# Patient Record
Sex: Male | Born: 1985 | Race: White | Hispanic: No | Marital: Single | Smoking: Former smoker
Health system: Southern US, Community
[De-identification: ages and names within clinical notes are randomized; demographics above are authoritative.]

## PROBLEM LIST (undated history)

## (undated) DIAGNOSIS — Z720 Tobacco use: Secondary | ICD-10-CM

## (undated) HISTORY — PX: APPENDECTOMY: SHX54

---

## 2006-01-29 ENCOUNTER — Ambulatory Visit: Payer: Self-pay | Admitting: Internal Medicine

## 2006-04-30 ENCOUNTER — Ambulatory Visit: Payer: Self-pay | Admitting: Internal Medicine

## 2009-08-05 ENCOUNTER — Emergency Department (HOSPITAL_COMMUNITY): Admission: EM | Admit: 2009-08-05 | Discharge: 2009-08-05 | Payer: Self-pay | Admitting: Emergency Medicine

## 2009-09-18 ENCOUNTER — Encounter (INDEPENDENT_AMBULATORY_CARE_PROVIDER_SITE_OTHER): Payer: Self-pay | Admitting: Surgery

## 2009-09-18 ENCOUNTER — Observation Stay (HOSPITAL_COMMUNITY): Admission: EM | Admit: 2009-09-18 | Discharge: 2009-09-19 | Payer: Self-pay | Admitting: Emergency Medicine

## 2010-05-22 ENCOUNTER — Other Ambulatory Visit: Payer: Self-pay | Admitting: Internal Medicine

## 2010-05-22 ENCOUNTER — Ambulatory Visit (INDEPENDENT_AMBULATORY_CARE_PROVIDER_SITE_OTHER): Payer: BC Managed Care – PPO | Admitting: Internal Medicine

## 2010-05-22 ENCOUNTER — Ambulatory Visit: Payer: Self-pay | Admitting: Internal Medicine

## 2010-05-22 ENCOUNTER — Encounter: Payer: Self-pay | Admitting: Internal Medicine

## 2010-05-22 DIAGNOSIS — R109 Unspecified abdominal pain: Secondary | ICD-10-CM | POA: Insufficient documentation

## 2010-05-22 DIAGNOSIS — M545 Low back pain: Secondary | ICD-10-CM

## 2010-05-22 LAB — CONVERTED CEMR LAB
Bilirubin Urine: NEGATIVE
Blood in Urine, dipstick: NEGATIVE
Urobilinogen, UA: NEGATIVE
WBC Urine, dipstick: NEGATIVE
pH: 8

## 2010-05-22 LAB — CBC WITH DIFFERENTIAL/PLATELET
Basophils Absolute: 0 10*3/uL (ref 0.0–0.1)
Eosinophils Absolute: 0.2 10*3/uL (ref 0.0–0.7)
Eosinophils Relative: 3.9 % (ref 0.0–5.0)
HCT: 45.6 % (ref 39.0–52.0)
Hemoglobin: 15.9 g/dL (ref 13.0–17.0)
Monocytes Absolute: 0.5 10*3/uL (ref 0.1–1.0)
Neutro Abs: 3.8 10*3/uL (ref 1.4–7.7)
Neutrophils Relative %: 58.9 % (ref 43.0–77.0)
Platelets: 143 10*3/uL — ABNORMAL LOW (ref 150.0–400.0)
RBC: 5.01 Mil/uL (ref 4.22–5.81)
WBC: 6.4 10*3/uL (ref 4.5–10.5)

## 2010-05-30 NOTE — Assessment & Plan Note (Signed)
Summary: stomach problems for years --on and off///sph--hasnt been see...   Vital Signs:  Patient profile:   25 year old male Weight:      193.8 pounds Temp:     98.2 degrees F oral Pulse rate:   80 / minute Resp:     14 per minute BP sitting:   130 / 84  (left arm) Cuff size:   large  Vitals Entered By: Shonna Chock CMA (May 22, 2010 10:51 AM) CC: 1.) Stomach problems   2.) Lower back pains, work related, Abdominal pain, Back pain   CC:  1.) Stomach problems   2.) Lower back pains, work related, Abdominal pain, and Back pain.  History of Present Illness:    Onset of  intermittent , "once a month" abdominal pain several years ago but progressive over past month, now lasting up to a week @ a time.He denies nausea, vomiting, diarrhea, constipation, melena, hematochezia, anorexia, and hematemesis.  The location of the pain is suprapubic.  The pain is described as cramping in quality.  Associated symptoms include weight loss of 60 # over 3 years with exercise (see  job description below) .  The patient denies the following symptoms: fever, dysuria, chest pain, jaundice, and dark urine.  The pain is worse with food.  The pain has not been  treated with  meds.He drinks 4-5 cups of coffee / day.       The patient also presents with Back pain present for years.  The patient denies weakness, loss of sensation, fecal incontinence, urinary incontinence, and urinary retention.  The pain is located in the mid low back.  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 The pain is made worse by  this work  activity.  The pain is made  minimally better by  Excedrin 5-6 pills / week.  Allergies (verified): No Known Drug Allergies  Past History:  Past Surgical History: Appendectomy 09/18/2009  Family History: Father: Living: HTN. P-Uncle:DM Mother:  Living Siblings: 2 Brother's, 3 Sister's  Physical Exam  General:  well-nourished,in no acute distress; alert,appropriate and cooperative throughout examination Eyes:  No corneal or conjunctival inflammation noted. Perrla. No icterus Mouth:  Oral mucosa and oropharynx without lesions or exudates.  Teeth in good repair.Minimal pharyngeal erythema.   Lungs:  Normal respiratory effort, chest expands symmetrically. Lungs are clear to auscultation, no crackles or wheezes. Heart:  Normal rate and regular rhythm. S1 and S2 normal without gallop, murmur, click, rub or other extra sounds. Abdomen:  Bowel sounds positive,abdomen soft and  minimally tender suprapubic area  without masses, organomegaly or hernias noted. Striae over abdomen Msk:  No deformity or scoliosis noted of thoracic or lumbar spine.   Extremities:  No clubbing, cyanosis, edema. Neg SLR  Neurologic:  alert & oriented X3, strength normal in all extremities, and DTRs symmetrical and normal.   Skin:  Intact without suspicious lesions or rashes No jaundice Cervical Nodes:  No lymphadenopathy noted Axillary Nodes:  No palpable lymphadenopathy Psych:  memory intact for recent and remote, normally interactive, and good eye contact.     Impression & Recommendations:  Problem # 1:  ABDOMINAL PAIN, SUPRAPUBIC (ICD-789.09)  Orders: Venipuncture (16109) TLB-CBC Platelet - w/Differential (85025-CBCD) Specimen Handling (60454) UA Dipstick w/o Micro (manual) (81002)  His updated medication list for this  problem includes:    Cyclobenzaprine Hcl 5 Mg Tabs (Cyclobenzaprine hcl) .Marland Kitchen... 1 at bedtime as needed  Problem # 2:  LOW BACK PAIN SYNDROME (ICD-724.2)  from lifting  His updated medication list for this problem includes:    Cyclobenzaprine Hcl 5 Mg Tabs (Cyclobenzaprine hcl) .Marland Kitchen... 1 at bedtime as needed  Complete Medication List: 1)  Multivitamins Tabs (Multiple vitamin) .Marland Kitchen.. 1 by mouth once daily 2)  Cyclobenzaprine Hcl 5 Mg  Tabs (Cyclobenzaprine hcl) .Marland Kitchen.. 1 at bedtime as needed 3)  Oscimin 0.125 Mg Subl (Hyoscyamine sulfate) .Marland Kitchen.. 1 under tongue every 6-8 hrs as needed for abd pain  Patient Instructions: 1)  Please complete stool cards Prescriptions: OSCIMIN 0.125 MG SUBL (HYOSCYAMINE SULFATE) 1 under tongue every 6-8 hrs as needed for abd pain  #30 x 1   Entered and Authorized by:   Marga Melnick MD   Signed by:   Marga Melnick MD on 05/22/2010   Method used:   Print then Give to Patient   RxID:   [id number] CYCLOBENZAPRINE HCL 5 MG TABS (CYCLOBENZAPRINE HCL) 1 at bedtime as needed  #15 x 0   Entered and Authorized by:   Marga Melnick MD   Signed by:   Marga Melnick MD on 05/22/2010   Method used:   Print then Give to Patient   RxID:   [id number]    Orders Added: 1)  Est. Patient Level IV [41660] 2)  Venipuncture [63016] 3)  TLB-CBC Platelet - w/Differential [85025-CBCD] 4)  Specimen Handling [99000] 5)  UA Dipstick w/o Micro (manual) [81002]    Laboratory Results   Urine Tests   Date/Time Reported: May 22, 2010 11:54 AM   Routine Urinalysis   Color: yellow Appearance: Clear Glucose: negative   (Normal Range: Negative) Bilirubin: negative   (Normal Range: Negative) Ketone: negative   (Normal Range: Negative) Spec. Gravity: <1.005   (Normal Range: 1.003-1.035) Blood: negative   (Normal Range: Negative) pH: 8.0   (Normal Range: 5.0-8.0) Protein: negative   (Normal Range: Negative) Urobilinogen: negative   (Normal Range: 0-1) Nitrite: negative   (Normal Range: Negative) Leukocyte Esterace: negative   (Normal Range: Negative)    Comments: Floydene Flock  May 22, 2010 11:54 AM

## 2010-06-02 ENCOUNTER — Other Ambulatory Visit (INDEPENDENT_AMBULATORY_CARE_PROVIDER_SITE_OTHER): Payer: BC Managed Care – PPO

## 2010-06-02 ENCOUNTER — Encounter (INDEPENDENT_AMBULATORY_CARE_PROVIDER_SITE_OTHER): Payer: Self-pay | Admitting: *Deleted

## 2010-06-02 ENCOUNTER — Encounter: Payer: Self-pay | Admitting: Internal Medicine

## 2010-06-02 DIAGNOSIS — Z1211 Encounter for screening for malignant neoplasm of colon: Secondary | ICD-10-CM

## 2010-06-02 LAB — CONVERTED CEMR LAB
OCCULT 1: NEGATIVE
OCCULT 2: NEGATIVE

## 2010-06-04 LAB — DIFFERENTIAL
Basophils Relative: 0 % (ref 0–1)
Eosinophils Absolute: 0 10*3/uL (ref 0.0–0.7)
Eosinophils Relative: 0 % (ref 0–5)
Lymphocytes Relative: 5 % — ABNORMAL LOW (ref 12–46)
Monocytes Relative: 5 % (ref 3–12)

## 2010-06-04 LAB — COMPREHENSIVE METABOLIC PANEL
ALT: 18 U/L (ref 0–53)
Albumin: 4.4 g/dL (ref 3.5–5.2)
BUN: 12 mg/dL (ref 6–23)
CO2: 24 mEq/L (ref 19–32)
Calcium: 9.8 mg/dL (ref 8.4–10.5)
Creatinine, Ser: 0.89 mg/dL (ref 0.4–1.5)
GFR calc Af Amer: >60 mL/min (ref >60)
Glucose, Bld: 112 mg/dL — ABNORMAL HIGH (ref 70–99)
Potassium: 3.8 mEq/L (ref 3.5–5.1)

## 2010-06-04 LAB — URINALYSIS, ROUTINE W REFLEX MICROSCOPIC
Ketones, ur: 40 mg/dL — AB
Nitrite: NEGATIVE
Protein, ur: NEGATIVE mg/dL
Specific Gravity, Urine: 1.029 (ref 1.005–1.030)
Urobilinogen, UA: 0.2 mg/dL (ref 0.0–1.0)
pH: 7.5 (ref 5.0–8.0)

## 2010-06-04 LAB — CBC
MCHC: 35.1 g/dL (ref 30.0–36.0)
MCV: 91.7 fL (ref 78.0–100.0)
RBC: 5.75 MIL/uL (ref 4.22–5.81)
WBC: 18.7 10*3/uL — ABNORMAL HIGH (ref 4.0–10.5)

## 2010-06-04 LAB — ETHANOL: Alcohol, Ethyl (B): <5 mg/dL (ref 0–10)

## 2010-06-04 LAB — LIPASE, BLOOD: Lipase: 24 U/L (ref 11–59)

## 2010-06-06 NOTE — Letter (Signed)
Summary: Results Follow up Letter  [facility]  [address] [place], [place] [address]   Phone: [phone]  Fax: [phone]    06/02/2010 MRN: [id number]  Jon Garcia [address] [place], [place]  [address]  [place]  Dear Mr. Dolinger,  The following are the results of your recent test(s):  Test         Result    Pap Smear:        Normal _____  Not Normal _____ Comments: ______________________________________________________ Cholesterol: LDL(Bad cholesterol):         Your goal is less than:         HDL (Good cholesterol):       Your goal is more than: Comments:  ______________________________________________________ Mammogram:        Normal _____  Not Normal _____ Comments:  ___________________________________________________________________ Hemoccult:        Normal ___X__  Not normal _______ Comments:    _____________________________________________________________________ Other Tests:    We routinely do not discuss normal results over the telephone.  If you desire a copy of the results, or you have any questions about this information we can discuss them at your next office visit.   Sincerely,

## 2011-04-09 IMAGING — CT CT ABD-PELV W/ CM
1 of 4 series · 15 of 34 positions shown, 19 images · IV contrast (water & 100 ML OMNI 300)
Comparison: None

CLINICAL DATA: Right-sided abdominal pain for 6 months, but
worsening today.  Leukocytosis.  Vomiting.

CT ABDOMEN AND PELVIS WITH CONTRAST
TECHNIQUE: Multidetector CT imaging of the abdomen and pelvis was
performed following the standard protocol during bolus
administration of intravenous contrast.
Contrast: 100 ml Lmnipaque-ROO

[Series 2: routine abdomen · axial · 0.81mm/px · z∈[-502,-47]mm · 15 of 101 slices shown, 19 images]
[im 5/101  soft-tissue]
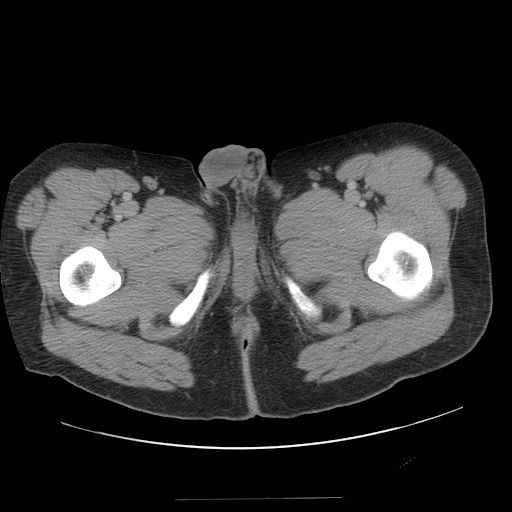
[im 5/101  bone]
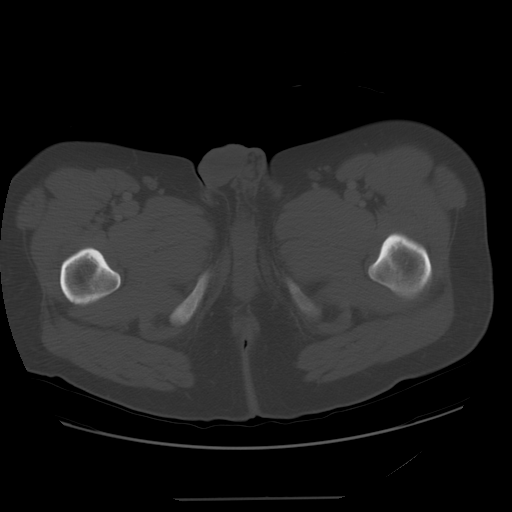
[im 13/101  soft-tissue]
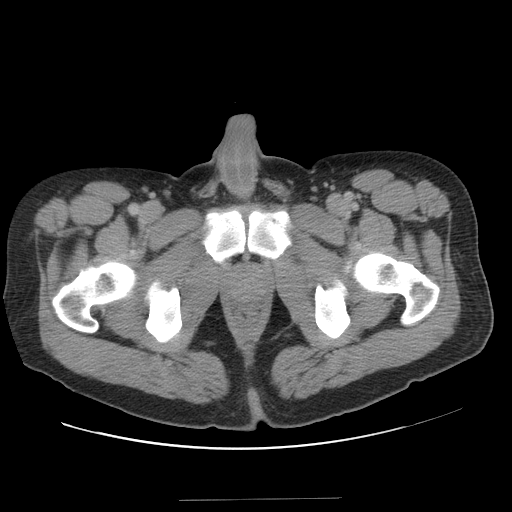
[im 21/101  soft-tissue]
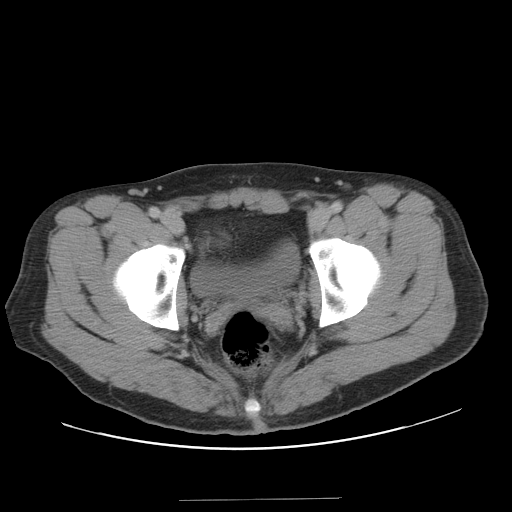
[im 30/101  soft-tissue]
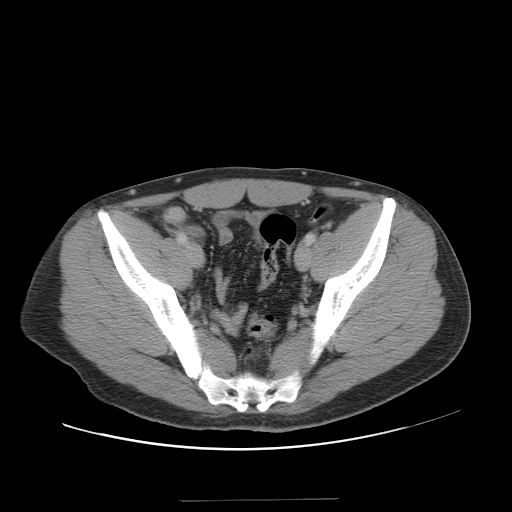
[im 34/101  soft-tissue]
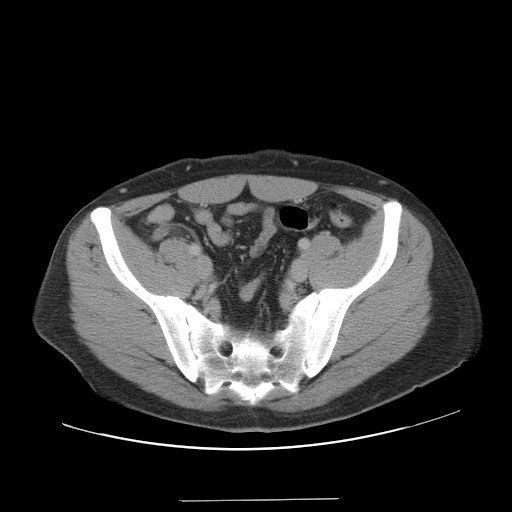
[im 42/101  soft-tissue]
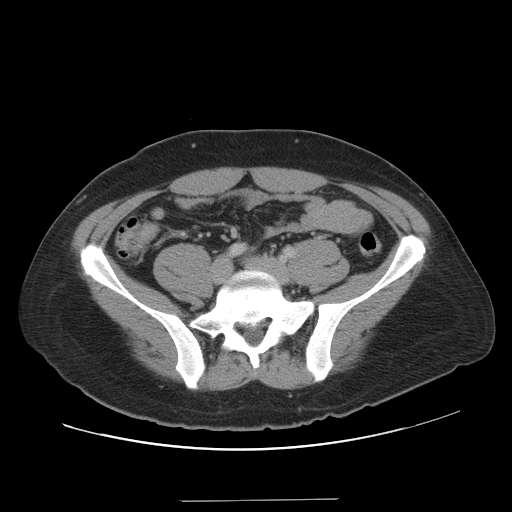
[im 51/101  soft-tissue]
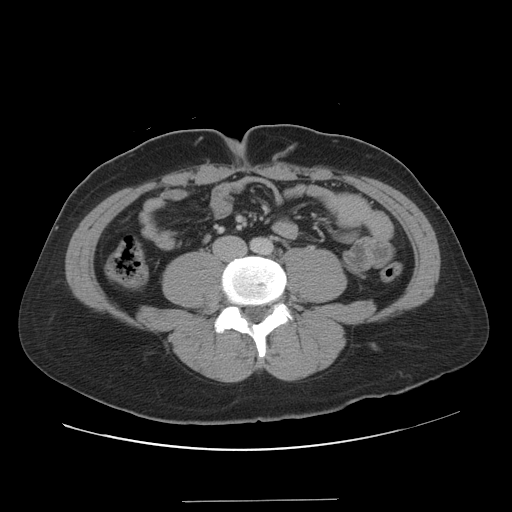
[im 59/101  soft-tissue]
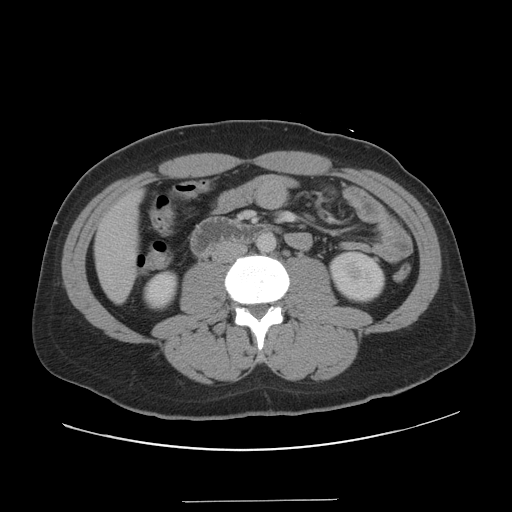
[im 67/101  soft-tissue]
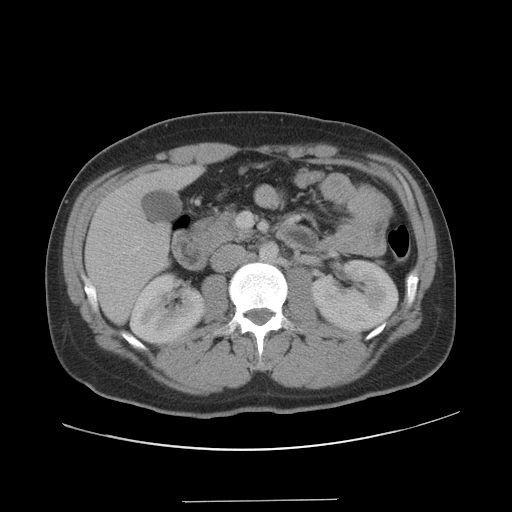
[im 67/101  bone]
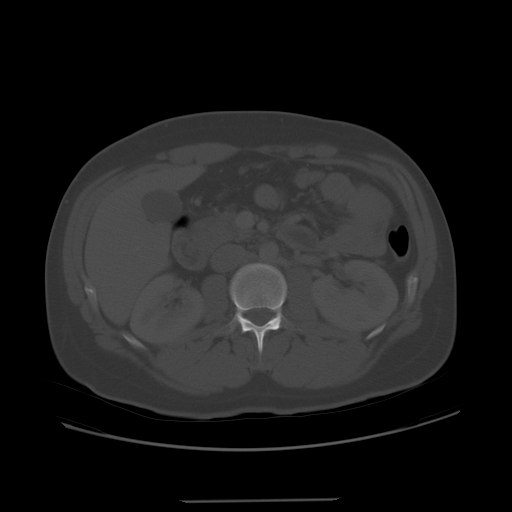
[im 71/101  soft-tissue]
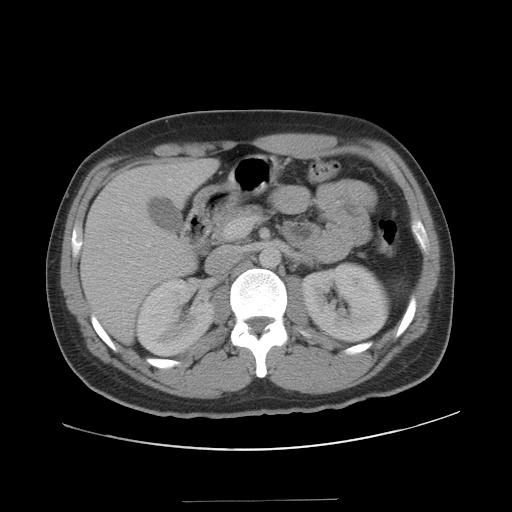
[im 80/101  soft-tissue]
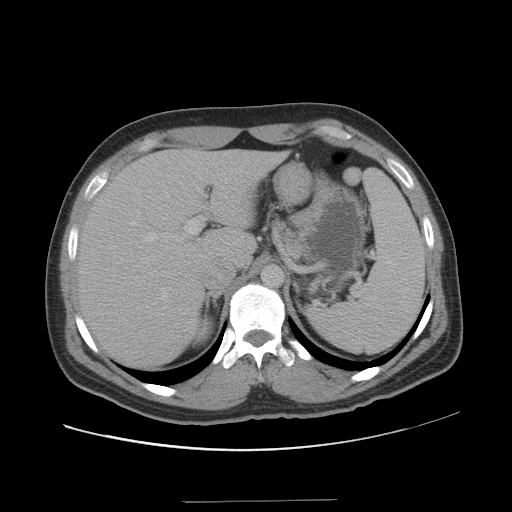
[im 84/101  lung]
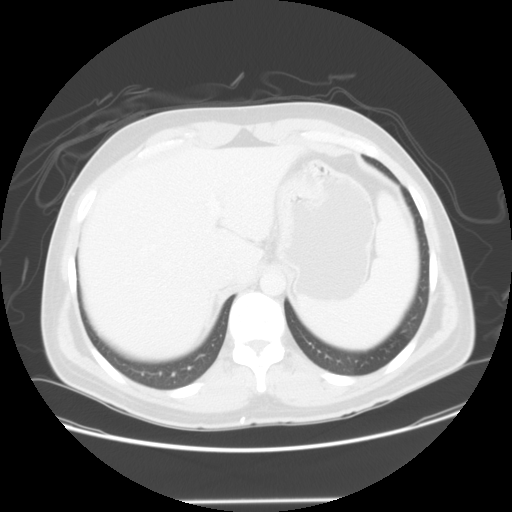
[im 88/101  soft-tissue]
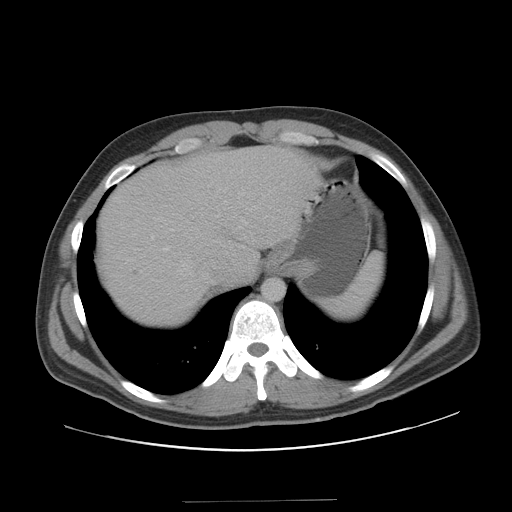
[im 88/101  lung]
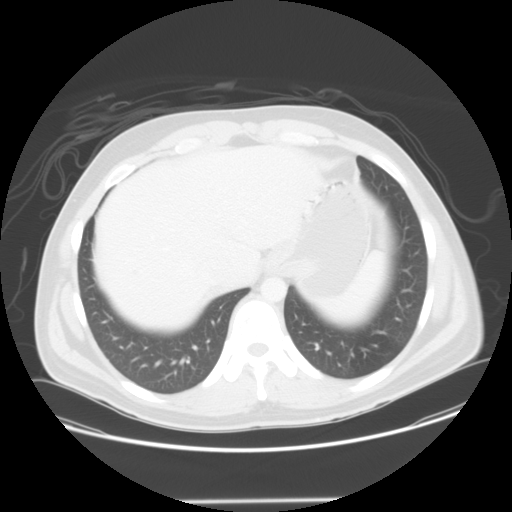
[im 92/101  lung]
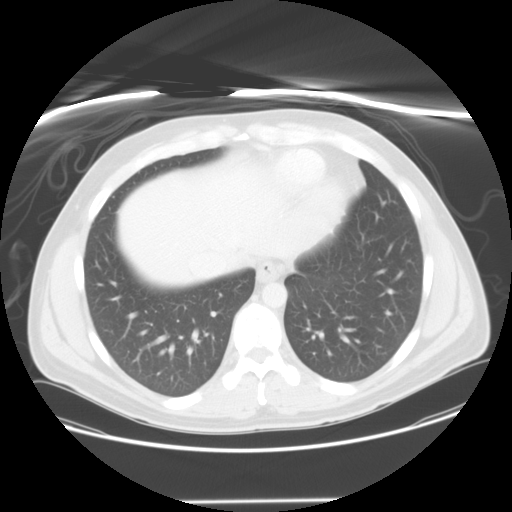
[im 96/101  soft-tissue]
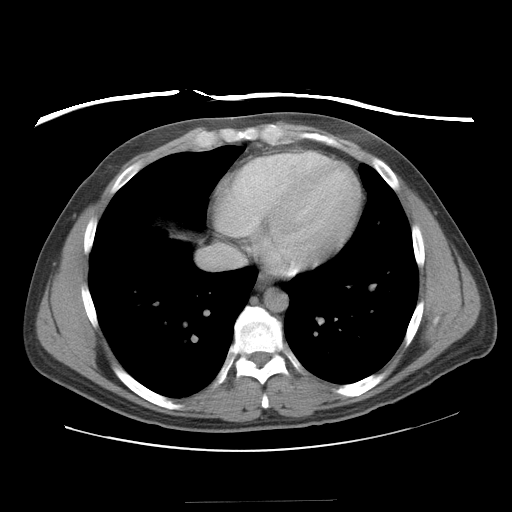
[im 96/101  lung]
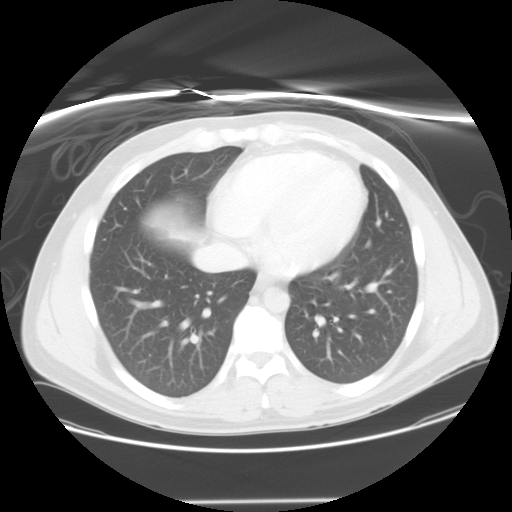

[15 of 34 positions shown; findings below may reference images not displayed]

FINDINGS: A hypodense lesion in the liver on image 14 of series 2
measures 3 mm in diameter and is technically nonspecific, although
statistically highly likely to be benign.

The spleen, adrenal glands, and pancreas appear unremarkable. The
gallbladder and biliary system appear unremarkable.

A circumaortic left renal vein noted.  Small retroperitoneal lymph
nodes are not pathologically enlarged by size criteria.

The kidneys appear unremarkable, as do the proximal ureters.

There is abnormal stranding adjacent to the appendix, which
measures 9 mm in diameter, compatible with early acute
appendicitis.

No dilated bowel noted.  Urinary bladder appears normal.  No
abscess or ascites.
IMPRESSION: 1.  Abnormal appendiceal wall thickening with surrounding stranding
compact compatible with early acute appendicitis.  No rupture or
periappendiceal abscess is currently identified.

## 2012-06-09 ENCOUNTER — Encounter (HOSPITAL_COMMUNITY): Payer: Self-pay | Admitting: Emergency Medicine

## 2012-06-09 ENCOUNTER — Emergency Department (HOSPITAL_COMMUNITY)
Admission: EM | Admit: 2012-06-09 | Discharge: 2012-06-09 | Disposition: A | Discharge status: Home / Self Care | Payer: BC Managed Care – PPO | Attending: Emergency Medicine | Admitting: Emergency Medicine

## 2012-06-09 DIAGNOSIS — F17200 Nicotine dependence, unspecified, uncomplicated: Secondary | ICD-10-CM | POA: Insufficient documentation

## 2012-06-09 DIAGNOSIS — H11419 Vascular abnormalities of conjunctiva, unspecified eye: Secondary | ICD-10-CM | POA: Insufficient documentation

## 2012-06-09 DIAGNOSIS — S058X9A Other injuries of unspecified eye and orbit, initial encounter: Secondary | ICD-10-CM | POA: Insufficient documentation

## 2012-06-09 DIAGNOSIS — Y929 Unspecified place or not applicable: Secondary | ICD-10-CM | POA: Insufficient documentation

## 2012-06-09 DIAGNOSIS — S0502XA Injury of conjunctiva and corneal abrasion without foreign body, left eye, initial encounter: Secondary | ICD-10-CM

## 2012-06-09 DIAGNOSIS — H53149 Visual discomfort, unspecified: Secondary | ICD-10-CM | POA: Insufficient documentation

## 2012-06-09 DIAGNOSIS — Y9389 Activity, other specified: Secondary | ICD-10-CM | POA: Insufficient documentation

## 2012-06-09 MED ORDER — TRAMADOL HCL 50 MG PO TABS: 50.0000 mg | ORAL_TABLET | Freq: Four times a day (QID) | ORAL | Status: DC | PRN

## 2012-06-09 MED ORDER — ERYTHROMYCIN 5 MG/GM OP OINT: TOPICAL_OINTMENT | OPHTHALMIC | Status: DC

## 2012-06-09 MED ORDER — FLUORESCEIN-BENOXINATE 0.25-0.4 % OP SOLN
2.0000 [drp] | Freq: Once | OPHTHALMIC | Status: AC
  Administered 2012-06-09: 2 [drp] via OPHTHALMIC
  Filled 2012-06-09: qty 5
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
## 2012-06-10 NOTE — ED Provider Notes (Signed)
Medical screening examination/treatment/procedure(s) were conducted as a shared visit with non-physician practitioner(s) and myself.  I personally evaluated the patient during the encounter Pt c/o ?fb sens left eye, eye injected since. Lids everted, no fb seen. Small corneal abrasion w flourescein staining. rx abx, ophthy f/u.   Suzi Roots, MD 06/10/12 [phone]

## 2015-04-01 ENCOUNTER — Telehealth: Payer: Self-pay | Admitting: Internal Medicine

## 2015-04-01 NOTE — Telephone Encounter (Signed)
Pt's dad, Darlyne RussianCharles Arline ([phone]) called in asking if you can take Jon MaduroRobert on as a new patient. He hasn't seen [facility]opp since 2012.  Please advise

## 2015-04-04 NOTE — Telephone Encounter (Signed)
yes

## 2015-04-04 NOTE — Telephone Encounter (Signed)
lmovm to call back to make an appointment

## 2022-10-24 ENCOUNTER — Emergency Department (HOSPITAL_BASED_OUTPATIENT_CLINIC_OR_DEPARTMENT_OTHER): Payer: Medicaid Other

## 2022-10-24 ENCOUNTER — Other Ambulatory Visit: Payer: Self-pay

## 2022-10-24 ENCOUNTER — Inpatient Hospital Stay (HOSPITAL_BASED_OUTPATIENT_CLINIC_OR_DEPARTMENT_OTHER)
Admission: EM | Admit: 2022-10-24 | Discharge: 2022-10-27 | DRG: 175 | Disposition: A | Discharge status: Home / Self Care | Payer: Medicaid Other | Attending: Internal Medicine | Admitting: Internal Medicine

## 2022-10-24 ENCOUNTER — Encounter (HOSPITAL_BASED_OUTPATIENT_CLINIC_OR_DEPARTMENT_OTHER): Payer: Self-pay | Admitting: Emergency Medicine

## 2022-10-24 DIAGNOSIS — I10 Essential (primary) hypertension: Secondary | ICD-10-CM | POA: Diagnosis present

## 2022-10-24 DIAGNOSIS — D72829 Elevated white blood cell count, unspecified: Secondary | ICD-10-CM | POA: Diagnosis present

## 2022-10-24 DIAGNOSIS — E871 Hypo-osmolality and hyponatremia: Secondary | ICD-10-CM | POA: Diagnosis present

## 2022-10-24 DIAGNOSIS — I824Y2 Acute embolism and thrombosis of unspecified deep veins of left proximal lower extremity: Secondary | ICD-10-CM | POA: Diagnosis not present

## 2022-10-24 DIAGNOSIS — I82432 Acute embolism and thrombosis of left popliteal vein: Secondary | ICD-10-CM | POA: Diagnosis present

## 2022-10-24 DIAGNOSIS — I82442 Acute embolism and thrombosis of left tibial vein: Secondary | ICD-10-CM | POA: Diagnosis present

## 2022-10-24 DIAGNOSIS — I82412 Acute embolism and thrombosis of left femoral vein: Secondary | ICD-10-CM | POA: Diagnosis present

## 2022-10-24 DIAGNOSIS — I82452 Acute embolism and thrombosis of left peroneal vein: Secondary | ICD-10-CM | POA: Diagnosis present

## 2022-10-24 DIAGNOSIS — F17290 Nicotine dependence, other tobacco product, uncomplicated: Secondary | ICD-10-CM | POA: Diagnosis present

## 2022-10-24 DIAGNOSIS — I2692 Saddle embolus of pulmonary artery without acute cor pulmonale: Secondary | ICD-10-CM | POA: Diagnosis not present

## 2022-10-24 DIAGNOSIS — R042 Hemoptysis: Secondary | ICD-10-CM | POA: Diagnosis present

## 2022-10-24 DIAGNOSIS — Z86711 Personal history of pulmonary embolism: Secondary | ICD-10-CM | POA: Diagnosis present

## 2022-10-24 DIAGNOSIS — I2602 Saddle embolus of pulmonary artery with acute cor pulmonale: Secondary | ICD-10-CM | POA: Diagnosis not present

## 2022-10-24 DIAGNOSIS — Z6841 Body Mass Index (BMI) 40.0 and over, adult: Secondary | ICD-10-CM

## 2022-10-24 DIAGNOSIS — Z801 Family history of malignant neoplasm of trachea, bronchus and lung: Secondary | ICD-10-CM | POA: Diagnosis not present

## 2022-10-24 DIAGNOSIS — Z79899 Other long term (current) drug therapy: Secondary | ICD-10-CM | POA: Diagnosis not present

## 2022-10-24 DIAGNOSIS — I82462 Acute embolism and thrombosis of left calf muscular vein: Secondary | ICD-10-CM | POA: Diagnosis present

## 2022-10-24 DIAGNOSIS — I2699 Other pulmonary embolism without acute cor pulmonale: Secondary | ICD-10-CM | POA: Diagnosis not present

## 2022-10-24 HISTORY — DX: Tobacco use: Z72.0

## 2022-10-24 LAB — CBC
HCT: 51.6 % (ref 39.0–52.0)
Hemoglobin: 17.9 g/dL — ABNORMAL HIGH (ref 13.0–17.0)
MCH: 29.5 pg (ref 26.0–34.0)
MCHC: 34.7 g/dL (ref 30.0–36.0)
MCV: 85.1 fL (ref 80.0–100.0)
Platelets: 180 10*3/uL (ref 150–400)
RBC: 6.06 MIL/uL — ABNORMAL HIGH (ref 4.22–5.81)
RDW: 12.6 % (ref 11.5–15.5)
WBC: 12.2 10*3/uL — ABNORMAL HIGH (ref 4.0–10.5)
nRBC: 0 % (ref 0.0–0.2)

## 2022-10-24 LAB — BASIC METABOLIC PANEL
Anion gap: 13 (ref 5–15)
BUN: 15 mg/dL (ref 6–20)
CO2: 25 mmol/L (ref 22–32)
Calcium: 9.7 mg/dL (ref 8.9–10.3)
Chloride: 100 mmol/L (ref 98–111)
Creatinine, Ser: 1.06 mg/dL (ref 0.61–1.24)
GFR, Estimated: >60 mL/min (ref >60)
Glucose, Bld: 105 mg/dL — ABNORMAL HIGH (ref 70–99)
Potassium: 4.1 mmol/L (ref 3.5–5.1)
Sodium: 138 mmol/L (ref 135–145)

## 2022-10-24 LAB — MAGNESIUM: Magnesium: 2.1 mg/dL (ref 1.7–2.4)

## 2022-10-24 LAB — TROPONIN I (HIGH SENSITIVITY)
Troponin I (High Sensitivity): 50 ng/L — ABNORMAL HIGH (ref <18)
Troponin I (High Sensitivity): 54 ng/L — ABNORMAL HIGH (ref <18)

## 2022-10-24 LAB — LACTIC ACID, PLASMA
Lactic Acid, Venous: 1.3 mmol/L (ref 0.5–1.9)
Lactic Acid, Venous: 1.3 mmol/L (ref 0.5–1.9)

## 2022-10-24 LAB — ANTITHROMBIN III: AntiThromb III Func: 93 % (ref 75–120)

## 2022-10-24 LAB — MRSA NEXT GEN BY PCR, NASAL: MRSA by PCR Next Gen: NOT DETECTED

## 2022-10-24 LAB — GLUCOSE, CAPILLARY: Glucose-Capillary: 99 mg/dL (ref 70–99)

## 2022-10-24 LAB — BRAIN NATRIURETIC PEPTIDE: B Natriuretic Peptide: 65.6 pg/mL (ref 0.0–100.0)

## 2022-10-24 MED ORDER — HYDROMORPHONE HCL 1 MG/ML IJ SOLN
1.0000 mg | INTRAMUSCULAR | Status: DC | PRN
  Administered 2022-10-24 – 2022-10-25 (×2): 1 mg via INTRAVENOUS
  Filled 2022-10-24 (×2): qty 1

## 2022-10-24 MED ORDER — POLYETHYLENE GLYCOL 3350 17 G PO PACK: 17.0000 g | PACK | Freq: Every day | ORAL | Status: DC | PRN

## 2022-10-24 MED ORDER — ACETAMINOPHEN 325 MG PO TABS
650.0000 mg | ORAL_TABLET | Freq: Four times a day (QID) | ORAL | Status: DC | PRN
  Administered 2022-10-24: 650 mg via ORAL
  Filled 2022-10-24: qty 2

## 2022-10-24 MED ORDER — HYDRALAZINE HCL 20 MG/ML IJ SOLN: 10.0000 mg | Freq: Four times a day (QID) | INTRAMUSCULAR | Status: DC | PRN

## 2022-10-24 MED ORDER — HEPARIN (PORCINE) 25000 UT/250ML-% IV SOLN
2000.0000 [IU]/h | INTRAVENOUS | Status: DC
  Administered 2022-10-24: 1750 [IU]/h via INTRAVENOUS
  Administered 2022-10-25: 2000 [IU]/h via INTRAVENOUS
  Filled 2022-10-24 (×2): qty 250

## 2022-10-24 MED ORDER — OXYCODONE HCL 5 MG PO TABS
5.0000 mg | ORAL_TABLET | ORAL | Status: DC | PRN
  Administered 2022-10-24 – 2022-10-25 (×3): 10 mg via ORAL
  Administered 2022-10-25: 5 mg via ORAL
  Filled 2022-10-24: qty 1
  Filled 2022-10-24 (×3): qty 2

## 2022-10-24 MED ORDER — CHLORHEXIDINE GLUCONATE CLOTH 2 % EX PADS
6.0000 | MEDICATED_PAD | Freq: Every day | CUTANEOUS | Status: DC
  Administered 2022-10-24 – 2022-10-27 (×3): 6 via TOPICAL

## 2022-10-24 MED ORDER — MORPHINE SULFATE (PF) 2 MG/ML IV SOLN
2.0000 mg | INTRAVENOUS | Status: DC | PRN
  Administered 2022-10-24: 2 mg via INTRAVENOUS
  Filled 2022-10-24: qty 1

## 2022-10-24 MED ORDER — DOCUSATE SODIUM 100 MG PO CAPS: 100.0000 mg | ORAL_CAPSULE | Freq: Two times a day (BID) | ORAL | Status: DC | PRN

## 2022-10-24 MED ORDER — HEPARIN BOLUS VIA INFUSION
6500.0000 [IU] | Freq: Once | INTRAVENOUS | Status: AC
  Administered 2022-10-24: 6500 [IU] via INTRAVENOUS

## 2022-10-24 MED ORDER — IOHEXOL 350 MG/ML SOLN
100.0000 mL | Freq: Once | INTRAVENOUS | Status: AC | PRN
  Administered 2022-10-24: 75 mL via INTRAVENOUS

## 2022-10-24 MED ORDER — MORPHINE SULFATE (PF) 4 MG/ML IV SOLN
4.0000 mg | Freq: Once | INTRAVENOUS | Status: AC
  Administered 2022-10-24: 4 mg via INTRAVENOUS
  Filled 2022-10-24: qty 1

## 2022-10-24 NOTE — H&P (Addendum)
NAME:  Jon Garcia, MRN:  [id number], DOB:  1985-11-20, LOS: 0 ADMISSION DATE:  10/24/2022, CONSULTATION DATE:  10/24/2022 REFERRING MD: Emmit Alexanders - EDP, CHIEF COMPLAINT:  Submassive PE   History of Present Illness:  37 year old man who presented to [facility] 8/7 for dyspnea on exertion x 1 month with decreased exercise tolerance. Reports shortness of breath and chest pain radiating to the neck worsened 4 days prior to admission.  Also reported episode of hemoptysis on date of admission. PMHx significant for tobacco/vape use.  On ED arrival, patient was tachypneic, tachycardic to 120s, and hypertensive.  Given concern for PE, CTA Chest was obtained revealing extensive saddle pulmonary embolus with significant clot burden and CT evidence of right heart strain, RV to LV ratio 1.03.  Labs were notable for Trop 50 > 54. BNP 65. WBC 12.2, Hgb 17.9, Plt 180. BMP WNL. LA 1.3. Hypercoagulability workup pending.  Given large clot burden with saddle component and possible need for intervention, PCCM consulted for ICU transfer and further management.  On arrival to [facility], patient anxious but overall well-appearing. Denies fever/chills, no current CP. Endorses mild SOB/chest pressure/palpitations. Denies nausea/vomiting, changes in bowel/urinary habits, no sick contacts. No leg swelling. Endorses tobacco and vape use and states after this incident he has decided to stop. Denies recent travel/sedentary periods, no recent surgeries, no personal or family history of blood clots that he is aware. Mother previously smoked and had lung CA. 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  Pertinent Medical History:  Tobacco/vape use  Significant Hospital Events: Including procedures, antibiotic start and stop dates in addition to other pertinent events   8/7 - Presented to [facility] with dyspnea, chest pain, and hemoptysis. CTA Chest demonstrated large saddle PE with significant clot  burden, RV/LV ratio 1.03. IR made aware, will manage conservatively for now. PCCM consulted for transfer to [facility] for further management.  Interim History / Subjective:  PCCM consulted for ICU admission in the setting of large saddle PE. Fortunately, hemodynamically stable at this time.  Objective:  Blood pressure (!) 182/110, pulse (!) 112, temperature 99 F (37.2 C), temperature source Oral, resp. rate 20, height 6' (1.829 m), weight 134.3 kg, SpO2 94%.       No intake or output data in the 24 hours ending 10/24/22 2100 Filed Weights   10/24/22 1634 10/24/22 1800  Weight: 134.3 kg 134.3 kg   Physical Examination: General: Overall well-appearing man in NAD. Appears mildly anxious. HEENT: [place]/AT, anicteric sclera, PERRL, moist mucous membranes. Neuro: Awake, oriented x 4. Responds to verbal stimuli. Following commands consistently. Moves all 4 extremities spontaneously.  CV: Tachycardic, regular rhythm, no m/g/r. PULM: Breathing even and unlabored on 3L (decreased to RA while in room without desaturation). Lung fields diminished bilaterally, no focal adventitious lung sounds. GI: Soft, nontender, nondistended. Normoactive bowel sounds. Extremities: No asymmetric LE edema noted. Skin: Warm/dry, no rashes.  Resolved Hospital Problem List:     Assessment & Plan:  Acute saddle PE, large clot burden Admitted with large saddle pulmonary embolus. CTA demonstrating extensive PE with saddle embolus with significant clot burden with evidence of mild R heart strain (RV/LV Ratio 1.03). Management options including conservative management, lytic administration and thrombectomy discussed with decision for initial conservative management and further workup, given hemodynamic stability. - Therapeutic anticoagulation with heparin gtt - F/u Echo - F/u LE Dopplers - F/u hypercoagulability workup - Eventual transition to PO anticoagulation once nearing discharge, minimum AC x 3 months -  IR aware of  patient, no urgent indication for intervention; will f/u pending studies and revisit possible thrombectomy/thrombolysis 8/8 if clinically indicated - NPO at midnight in the event IR intervention is necessary  Tobacco use Vape use - Encourage cessation  Best Practice (right click and "Reselect all SmartList Selections" daily)   Diet/type: Regular consistency (see orders) and NPO at midnight DVT prophylaxis: systemic heparin GI prophylaxis: PPI Lines: N/A Foley:  N/A Code Status:  full code Last date of multidisciplinary goals of care discussion: Patient updated at bedside 8/7PM  Labs   CBC: Recent Labs  Lab 10/24/22 1636  WBC 12.2*  HGB 17.9*  HCT 51.6  MCV 85.1  PLT 180   Basic Metabolic Panel: Recent Labs  Lab 10/24/22 1636  NA 138  K 4.1  CL 100  CO2 25  GLUCOSE 105*  BUN 15  CREATININE 1.06  CALCIUM 9.7  MG 2.1   GFR: Estimated Creatinine Clearance: 134 mL/min (by C-G formula based on SCr of 1.06 mg/dL). Recent Labs  Lab 10/24/22 1636 10/24/22 1826  WBC 12.2*  --   LATICACIDVEN  --  1.3   Liver Function Tests: No results for input(s): "AST", "ALT", "ALKPHOS", "BILITOT", "PROT", "ALBUMIN" in the last 168 hours. No results for input(s): "LIPASE", "AMYLASE" in the last 168 hours. No results for input(s): "AMMONIA" in the last 168 hours.  ABG No results found for: "PHART", "PCO2ART", "PO2ART", "HCO3", "TCO2", "ACIDBASEDEF", "O2SAT"   Coagulation Profile: No results for input(s): "INR", "PROTIME" in the last 168 hours.  Cardiac Enzymes: No results for input(s): "CKTOTAL", "CKMB", "CKMBINDEX", "TROPONINI" in the last 168 hours.  HbA1C: No results found for: "HGBA1C"  CBG: Recent Labs  Lab 10/24/22 2030  GLUCAP 99    Review of Systems:   Please see the history of present illness. All other systems reviewed and are negative   Past Medical History:  He,  has a past medical history of Tobacco use and Vapes nicotine containing substance.    Surgical History:   Past Surgical History:  Procedure Laterality Date   APPENDECTOMY      Social History:   reports that he has been smoking. He does not have any smokeless tobacco history on file. He reports current alcohol use. He reports that he does not use drugs.   Family History:  His family history is not on file.   Allergies: No Known Allergies   Home Medications: Prior to Admission medications   Medication Sig Start Date End Date Taking? Authorizing Provider  erythromycin ophthalmic ointment Place a 1/2 inch ribbon of ointment into the lower eyelid. 06/09/12   Junious Silk, PA-C  Multiple Vitamin (MULTIVITAMIN WITH MINERALS) TABS Take 1 tablet by mouth daily.    [provider]  OVER THE COUNTER MEDICATION Place 1 drop into the left eye once as needed (eye irritation.). Eye Drops.    [provider]  traMADol (ULTRAM) 50 MG tablet Take 1 tablet (50 mg total) by mouth every 6 (six) hours as needed for pain. 06/09/12   Junious Silk, PA-C   Signature:   Tim Lair, PA-C [facility] 10/24/22 9:00 PM  Please see [email or web address] for pager details.  From 7A-7P if no response, please call [phone] After hours, please call ELink [phone]

## 2022-10-24 NOTE — Progress Notes (Signed)
eLink Physician-Brief Progress Note Patient Name: Jon Garcia DOB: 08-Nov-1985 MRN: [id number]   Date of Service  10/24/2022  HPI/Events of Note  37 year old male that presented to an outside hospital with dyspnea on exertion for months, decreased exercise tolerance, shortness of breath and an episode of hemoptysis found to have acute submassive pulmonary embolus.  Patient presented tachycardic, hypertensive, and tachypneic.  Saturating 97% on room air.  Metabolic panel unremarkable with preserved renal function.  Mild elevation in troponin-stable.  Elevated hemoglobin and mild leukocytosis.  CT angiography with extensive pulmonary emboli and mild RV dysfunction.  Potential lingular infarct.  eICU Interventions  Echocardiogram pending.  Lower extremity Dopplers pending.  Maintain heparin drip  May benefit from outpatient OSA workup in the setting of polycythemia, mild RV dysfunction, and morbid obesity.  GI prophylaxis not indicated.  DVT prophylaxis with therapeutic heparin.   0025 -he has ongoing nausea.  QT prolongation in the 600s in the ED.  Repeat ECG was performed and showed QTc may be 0.29?  Seems unrealistic.  Will order Ativan as needed for nausea/vomiting for now.  Intervention Category Evaluation Type: New Patient Evaluation  Caroline Longie 10/24/2022, 9:44 PM

## 2022-10-24 NOTE — ED Triage Notes (Signed)
Dyspnea with exertion x 1 month. States he had an episode Saturday of increased SOB, chest pain radiating to his neck. States he coughed up bright red blood today.

## 2022-10-24 NOTE — Progress Notes (Signed)
ANTICOAGULATION CONSULT NOTE - Initial Consult  Pharmacy Consult for heparin Indication: pulmonary embolus  No Known Allergies  Patient Measurements: Height: 6' (182.9 cm) Weight: 134.3 kg (296 lb 1.2 oz) IBW/kg (Calculated) : 77.6 Heparin Dosing Weight: 108.2 kg   Vital Signs: Temp: 98.7 F (37.1 C) (08/07 1629) BP: 156/114 (08/07 1629) Pulse Rate: 117 (08/07 1629)  Labs: Recent Labs    10/24/22 1636  HGB 17.9*  HCT 51.6  PLT 180  CREATININE 1.06  TROPONINIHS 50*    Estimated Creatinine Clearance: 134 mL/min (by C-G formula based on SCr of 1.06 mg/dL).   Medical History: History reviewed. No pertinent past medical history.  Medications:  Scheduled:  Infusions:   Assessment: 37 yo male presenting with chest pain, SOB, and hemoptysis. CT positive for pulmonary emboli. Hgb 17.9, plt 180, no signs of bleeding noted. Pharmacy consulted to dose heparin for PE.   Goal of Therapy:  Heparin level 0.3-0.7 units/ml Monitor platelets by anticoagulation protocol: Yes   Plan:  Give heparin bolus 6500 units x 1  Then, start heparin 1750 units/hr  6 hour heparin level  Monitor CBC, heparin level, and s/sx of bleeding   Jon Garcia 10/24/2022,6:07 PM

## 2022-10-24 NOTE — ED Provider Notes (Signed)
[place] EMERGENCY DEPARTMENT AT [facility] Provider Note   CSN: [id number] Arrival date & time: 10/24/22  1623     History  Chief Complaint  Patient presents with   Shortness of Breath    Jon Garcia is a 37 y.o. male.  Patient presents to the emergency department today for evaluation of chest pain, shortness of breath, hemoptysis.  Patient has noted decreased exercise tolerance, albeit mild, over the past 1 month.  4 days ago he developed worsening pain in his left chest pain with worsening shortness of breath.  He developed a cough with hemoptysis.  Reports that right-sided chest pain radiates up into the right neck.  Patient denies risk factors for pulmonary embolism including: unilateral leg swelling, history of DVT/PE/other blood clots, use of exogenous hormones including testosterone, recent immobilizations, recent surgery, recent travel (>4hr segment), malignancy.  He does smoke tobacco.  He denies any recent respiratory illnesses including COVID.  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  He states approximately 50 pound weight gain over that time.  He does do a lot of sitting, but does get up and take breaks.  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       Home Medications Prior to Admission medications   Medication Sig Start Date End Date Taking? Authorizing Provider  erythromycin ophthalmic ointment Place a 1/2 inch ribbon of ointment into the lower eyelid. 06/09/12   Junious Silk, PA-C  Multiple Vitamin (MULTIVITAMIN WITH MINERALS) TABS Take 1 tablet by mouth daily.    [provider]  OVER THE COUNTER MEDICATION Place 1 drop into the left eye once as needed (eye irritation.). Eye Drops.    [provider]  traMADol (ULTRAM) 50 MG tablet Take 1 tablet (50 mg total) by mouth every 6 (six) hours as needed for pain. 06/09/12   Junious Silk, PA-C       Allergies    Patient has no known allergies.    Review of Systems   Review of Systems  Physical Exam Updated Vital Signs BP (!) 156/114 (BP Location: Right Arm)   Pulse (!) 117   Temp 98.7 F (37.1 C)   Resp (!) 24   Ht 6' (1.829 m)   Wt 134.3 kg   SpO2 99%   BMI 40.16 kg/m   Physical Exam Vitals and nursing note reviewed.  Constitutional:      Appearance: He is well-developed. He is not diaphoretic.  HENT:     Head: Normocephalic and atraumatic.     Mouth/Throat:     Mouth: Mucous membranes are moist. Mucous membranes are not dry.  Eyes:     Conjunctiva/sclera: Conjunctivae normal.  Neck:     Vascular: Normal carotid pulses. No carotid bruit or JVD.     Trachea: Trachea normal. No tracheal deviation.  Cardiovascular:     Rate and Rhythm: Regular rhythm. Tachycardia present.     Pulses: No decreased pulses.          Radial pulses are 2+ on the right side and 2+ on the left side.     Heart sounds: Normal heart sounds, S1 normal and S2 normal. Heart sounds not distant. No murmur heard. Pulmonary:     Effort: Pulmonary effort is normal. Tachypnea present. No respiratory distress.     Breath sounds: Normal breath sounds. No wheezing.     Comments: Mild tachypnea  but no distress Chest:     Chest wall: No tenderness.  Abdominal:     General: Bowel sounds are normal.     Palpations: Abdomen is soft.     Tenderness: There is no abdominal tenderness. There is no guarding or rebound.  Musculoskeletal:     Cervical back: Normal range of motion and neck supple. No muscular tenderness.     Right lower leg: No tenderness. No edema.     Left lower leg: No tenderness. No edema.     Comments: No clinical signs and symptoms of DVT  Skin:    General: Skin is warm and dry.     Coloration: Skin is not pale.  Neurological:     Mental Status: He is alert. Mental status is at baseline.  Psychiatric:        Mood and Affect: Mood normal.     ED Results / Procedures /  Treatments   Labs (all labs ordered are listed, but only abnormal results are displayed) Labs Reviewed  BASIC METABOLIC PANEL - Abnormal; Notable for the following components:      Result Value   Glucose, Bld 105 (*)    All other components within normal limits  CBC - Abnormal; Notable for the following components:   WBC 12.2 (*)    RBC 6.06 (*)    Hemoglobin 17.9 (*)    All other components within normal limits  MAGNESIUM  TROPONIN I (HIGH SENSITIVITY)    EKG EKG Interpretation Date/Time:  Wednesday October 24 2022 16:33:07 EDT Ventricular Rate:  116 PR Interval:  104 QRS Duration:  86 QT Interval:  454 QTC Calculation: 631 R Axis:   30  Text Interpretation: Sinus tachycardia with short PR ST & T wave abnormality, consider anterior ischemia Prolonged QT Abnormal ECG No previous ECGs available Confirmed by Jacalyn Lefevre [phone]) on 10/24/2022 5:15:45 PM  Radiology CT Angio Chest PE W and/or Wo Contrast  Result Date: 10/24/2022 CLINICAL DATA:  Dyspnea on exertion. EXAM: CT ANGIOGRAPHY CHEST WITH CONTRAST TECHNIQUE: Multidetector CT imaging of the chest was performed using the standard protocol during bolus administration of intravenous contrast. Multiplanar CT image reconstructions and MIPs were obtained to evaluate the vascular anatomy. RADIATION DOSE REDUCTION: This exam was performed according to the departmental dose-optimization program which includes automated exposure control, adjustment of the mA and/or kV according to patient size and/or use of iterative reconstruction technique. CONTRAST:  75mL OMNIPAQUE IOHEXOL 350 MG/ML SOLN COMPARISON:  X-ray earlier 10/24/2022 FINDINGS: Cardiovascular: Multiple bilateral pulmonary emboli identified including central saddle embolus. Large clot burden overall. Heart is slightly enlarged. There is some enlargement of the main pulmonary artery. Calculated RV to LV ratio 1.03. Please correlate for signs of right heart strain. Some reflux of  contrast into the intrahepatic IVC. Small pericardial effusion. Bovine type aortic arch. Normal caliber thoracic aorta. Mediastinum/Nodes: No specific abnormal lymph node enlargement identified in the axillary regions or hilum. There are a few small less than 1 cm size nodes identified in the mediastinum, nonpathologic by size criteria. Preserved thoracic esophagus. Preserved thyroid gland. Lungs/Pleura: Trace left-sided pleural fluid with the adjacent opacity. There is ill-defined hazy opacity along the lingula posterolaterally, possible developing infarct but there is a differential. Recommend follow-up. No pneumothorax. The right lung is grossly clear. Upper Abdomen: Severe fatty liver infiltration seen along the visualized portions of the liver in the upper abdomen. The adrenal glands are incompletely included in the imaging field. Musculoskeletal: Scattered degenerative changes along the spine with  bridging osteophytes and syndesmophytes. Review of the MIP images confirms the above findings. IMPRESSION: Extensive pulmonary emboli with saddle embolus. Significant clot burden. Heart is slightly enlarged as is the main pulmonary artery. Please correlate for any clinical evidence of right heart strain. Possible developing infarct along the lingula with a tiny left effusion. This has a differential overall recommend follow-up. Fatty liver infiltration. Critical Value/emergent results were called by telephone at the time of interpretation on 10/24/2022 at 3:10 pm to provider [facility] , who verbally acknowledged these results. Electronically Signed   By: Karen Kays M.D.   On: 10/24/2022 18:15   DG Chest Port 1 View  Result Date: 10/24/2022 CLINICAL DATA:  Shortness of breath EXAM: PORTABLE CHEST 1 VIEW COMPARISON:  None Available. FINDINGS: No consolidation, pneumothorax or effusion. Normal cardiopericardial silhouette without edema. Overlapping cardiac leads. Degenerative changes seen along the spine.  Underinflation. IMPRESSION: Underinflation.  No acute cardiopulmonary disease. Electronically Signed   By: Karen Kays M.D.   On: 10/24/2022 17:42    Procedures Procedures    Medications Ordered in ED Medications - No data to display  ED Course/ Medical Decision Making/ A&P    Patient seen and examined. History obtained directly from patient.   Labs/EKG: Ordered CBC, BMP, troponin.  Imaging: Ordered CT angiography of the chest to evaluate for PE, chest x-ray.  Medications/Fluids: Ordered: None ordered.   Most recent vital signs reviewed and are as follows: BP (!) 156/114 (BP Location: Right Arm)   Pulse (!) 117   Temp 98.7 F (37.1 C)   Resp (!) 24   Ht 6' (1.829 m)   Wt 134.3 kg   SpO2 99%   BMI 40.16 kg/m   Initial impression: Patient with constellation of symptoms concerning for pulmonary embolism, although unclear why he would have this.  Symptoms are not as consistent with pneumonia.  Also consider lung mass as possible etiology.  Feel high risk [place], patient will receive imaging.     Reassessment performed. Patient appears stable. Remains tachycardic.   Labs personally reviewed and interpreted including: CBC with elevated white blood cell count of 12.2, hemoglobin elevated at 17.9 otherwise unremarkable; BMP unremarkable with normal kidney function; troponin elevated at 50 indicating concern for right heart strain; magnesium 2.1.  Imaging personally visualized and interpreted including: Chest x-ray, hampton's hump noted. CT reviewed, concern for saddle pulmonary embolism with infarct.  Awaiting read.  Reviewed pertinent lab work and imaging with patient at bedside. Questions answered.   Most current vital signs reviewed and are as follows: BP (!) 156/114 (BP Location: Right Arm)   Pulse (!) 117   Temp 98.7 F (37.1 C)   Resp (!) 24   Ht 6' (1.829 m)   Wt 134.3 kg   SpO2 99%   BMI 40.16 kg/m   Plan: Will likely need admission to ICU.  Patient discussed  with Dr. Particia Nearing who will see.     6:46 PM Reassessment performed. Patient appears stable.  Labs personally reviewed and interpreted including: Ordered hypercoagulable workup prior to heparinization.  Reviewed pertinent lab work and imaging with patient at bedside. Questions answered.   Most current vital signs reviewed and are as follows: BP (!) 156/114 (BP Location: Right Arm)   Pulse (!) 117   Temp 98.7 F (37.1 C)   Resp (!) 24   Ht 6' (1.829 m)   Wt 134.3 kg   SpO2 99%   BMI 40.16 kg/m   Plan: Admit to hospital.  I was called by Dr. Chales Abrahams of radiology and discussed the case.  I spoke with APP Whitney with pulmonary critical care who has reviewed the images with Dr. Merrily Pew.  They recommend admission to the ICU.  They asked that I add lactate and BNP.  Patient may be a candidate for interventional radiology treatment.  Patient up-to-date and agrees with plan.  Heparin has been ordered.  CRITICAL CARE Performed by: Renne Crigler PA-C Total critical care time: 50 minutes Critical care time was exclusive of separately billable procedures and treating other patients. Critical care was necessary to treat or prevent imminent or life-threatening deterioration. Critical care was time spent personally by me on the following activities: development of treatment plan with patient and/or surrogate as well as nursing, discussions with consultants, evaluation of patient's response to treatment, examination of patient, obtaining history from patient or surrogate, ordering and performing treatments and interventions, ordering and review of laboratory studies, ordering and review of radiographic studies, pulse oximetry and re-evaluation of patient's condition.                                  Medical Decision Making Amount and/or Complexity of Data Reviewed Labs: ordered. Radiology: ordered.  Risk Prescription drug management. Decision regarding hospitalization.   Patient with  saddle PE with right heart strain.  He will be admitted to the ICU.  Unclear etiology with minimal risk factors at this time.        Final Clinical Impression(s) / ED Diagnoses Final diagnoses:  Acute saddle pulmonary embolism with acute cor pulmonale [facility])    Rx / DC Orders ED Discharge Orders     None         Renne Crigler, PA-C 10/24/22 1850    Jacalyn Lefevre, MD 10/24/22 1910

## 2022-10-25 ENCOUNTER — Other Ambulatory Visit (HOSPITAL_COMMUNITY): Payer: Self-pay

## 2022-10-25 ENCOUNTER — Inpatient Hospital Stay (HOSPITAL_COMMUNITY): Payer: Medicaid Other

## 2022-10-25 DIAGNOSIS — I2602 Saddle embolus of pulmonary artery with acute cor pulmonale: Secondary | ICD-10-CM

## 2022-10-25 DIAGNOSIS — Z86711 Personal history of pulmonary embolism: Secondary | ICD-10-CM

## 2022-10-25 LAB — CBC
HCT: 48.8 % (ref 39.0–52.0)
Hemoglobin: 16.3 g/dL (ref 13.0–17.0)
MCH: 29.3 pg (ref 26.0–34.0)
MCHC: 33.4 g/dL (ref 30.0–36.0)
MCV: 87.6 fL (ref 80.0–100.0)
Platelets: 155 10*3/uL (ref 150–400)
RBC: 5.57 MIL/uL (ref 4.22–5.81)
RDW: 13.2 % (ref 11.5–15.5)
WBC: 11.4 10*3/uL — ABNORMAL HIGH (ref 4.0–10.5)
nRBC: 0 % (ref 0.0–0.2)

## 2022-10-25 LAB — APTT
aPTT: 51 seconds — ABNORMAL HIGH (ref 24–36)
aPTT: 58 seconds — ABNORMAL HIGH (ref 24–36)
aPTT: 66 seconds — ABNORMAL HIGH (ref 24–36)

## 2022-10-25 LAB — PROTIME-INR
INR: 1.1 (ref 0.8–1.2)
Prothrombin Time: 14.6 seconds (ref 11.4–15.2)

## 2022-10-25 LAB — HEPARIN LEVEL (UNFRACTIONATED)
Heparin Unfractionated: 0.24 IU/mL — ABNORMAL LOW (ref 0.30–0.70)
Heparin Unfractionated: 0.25 IU/mL — ABNORMAL LOW (ref 0.30–0.70)

## 2022-10-25 MED ORDER — ONDANSETRON HCL 4 MG/2ML IJ SOLN
4.0000 mg | Freq: Four times a day (QID) | INTRAMUSCULAR | Status: DC | PRN
  Administered 2022-10-25: 4 mg via INTRAVENOUS
  Filled 2022-10-25: qty 2

## 2022-10-25 MED ORDER — HEPARIN BOLUS VIA INFUSION
5000.0000 [IU] | Freq: Once | INTRAVENOUS | Status: AC
  Administered 2022-10-25: 5000 [IU] via INTRAVENOUS
  Filled 2022-10-25: qty 5000

## 2022-10-25 MED ORDER — POTASSIUM CHLORIDE CRYS ER 20 MEQ PO TBCR
40.0000 meq | EXTENDED_RELEASE_TABLET | Freq: Once | ORAL | Status: AC
  Administered 2022-10-25: 40 meq via ORAL
  Filled 2022-10-25: qty 2

## 2022-10-25 MED ORDER — LORAZEPAM 1 MG PO TABS: 1.0000 mg | ORAL_TABLET | Freq: Four times a day (QID) | ORAL | Status: DC | PRN

## 2022-10-25 MED ORDER — HEPARIN (PORCINE) 25000 UT/250ML-% IV SOLN
2450.0000 [IU]/h | INTRAVENOUS | Status: DC
  Administered 2022-10-25: 2200 [IU]/h via INTRAVENOUS
  Administered 2022-10-26: 2400 [IU]/h via INTRAVENOUS
  Filled 2022-10-25 (×2): qty 250

## 2022-10-25 MED ORDER — SODIUM CHLORIDE 0.9 % IV SOLN
250.0000 mL | Freq: Once | INTRAVENOUS | Status: AC
  Administered 2022-10-25: 250 mL via INTRAVENOUS

## 2022-10-25 MED ORDER — ALTEPLASE (ACTIVASE) 10MG BOLUS + 40 MG INFUSION
50.0000 mg | Freq: Once | INTRAVENOUS | Status: AC
  Administered 2022-10-25: 50 mg via INTRAVENOUS
  Filled 2022-10-25: qty 50

## 2022-10-25 MED ORDER — LORAZEPAM 2 MG/ML IJ SOLN
1.0000 mg | Freq: Four times a day (QID) | INTRAMUSCULAR | Status: DC | PRN
  Administered 2022-10-25 (×2): 1 mg via INTRAVENOUS
  Filled 2022-10-25 (×3): qty 1

## 2022-10-25 NOTE — Progress Notes (Signed)
ANTICOAGULATION CONSULT NOTE  Pharmacy Consult for heparin Indication: pulmonary embolus  No Known Allergies  Patient Measurements: Height: 6' (182.9 cm) Weight: 134.3 kg (296 lb 1.2 oz) IBW/kg (Calculated) : 77.6 Heparin Dosing Weight: 108.2 kg   Vital Signs: Temp: 101.4 F (38.6 C) (08/07 2327) Temp Source: Axillary (08/07 2327) BP: 112/55 (08/08 0000) Pulse Rate: 105 (08/08 0000)  Labs: Recent Labs    10/24/22 1636 10/24/22 1836 10/25/22 0027  HGB 17.9*  --  16.8  HCT 51.6  --  49.2  PLT 180  --  168  HEPARINUNFRC  --   --  0.18*  CREATININE 1.06  --   --   TROPONINIHS 50* 54*  --     Estimated Creatinine Clearance: 134 mL/min (by C-G formula based on SCr of 1.06 mg/dL).   Medical History: Past Medical History:  Diagnosis Date   Tobacco use    Vapes nicotine containing substance       Assessment: 37 yo male presenting with chest pain, SOB, and hemoptysis. CT positive for pulmonary emboli. Hgb 17.9, plt 180, no signs of bleeding noted. Pharmacy consulted to dose heparin for PE.   8/8 AM update:  Heparin level sub-therapeutic  Saddle PE  Goal of Therapy:  Heparin level 0.3-0.7 units/ml Monitor platelets by anticoagulation protocol: Yes   Plan:  Heparin 5000 units re-bol[employer] heparin to 2000 units/hr 1000 heparin level  Abran Duke, PharmD, BCPS Clinical Pharmacist Phone: [phone]

## 2022-10-25 NOTE — Plan of Care (Signed)

## 2022-10-25 NOTE — Progress Notes (Signed)
ANTICOAGULATION CONSULT NOTE  Pharmacy Consult for heparin Indication: pulmonary embolus  No Known Allergies  Patient Measurements: Height: 6' (182.9 cm) Weight: 134.3 kg (296 lb 1.2 oz) IBW/kg (Calculated) : 77.6 Heparin Dosing Weight: 108.2 kg   Vital Signs: Temp: 98.9 F (37.2 C) (08/08 1111) Temp Source: Axillary (08/08 1111) BP: 135/90 (08/08 1300) Pulse Rate: 96 (08/08 1300)  Labs: Recent Labs    10/24/22 1636 10/24/22 1836 10/25/22 0027 10/25/22 1029 10/25/22 1037 10/25/22 1315  HGB 17.9*  --  16.8 16.3  --   --   HCT 51.6  --  49.2 48.8  --   --   PLT 180  --  168 155  --   --   APTT  --   --   --  51*  --  58*  LABPROT  --   --   --  14.6  --   --   INR  --   --   --  1.1  --   --   HEPARINUNFRC  --   --  0.18*  --  0.24*  --   CREATININE 1.06  --  1.25*  --   --   --   TROPONINIHS 50* 54*  --   --   --   --     Estimated Creatinine Clearance: 113.7 mL/min (A) (by C-G formula based on SCr of 1.25 mg/dL (H)).   Medical History: Past Medical History:  Diagnosis Date   Tobacco use    Vapes nicotine containing substance       Assessment: 37 yo male presenting with chest pain, SOB, and hemoptysis. CT positive for pulmonary emboli. Hgb 17.9, plt 180, no signs of bleeding noted. Pharmacy consulted to dose heparin for PE.   Alteplase 50mg  IV  x1 given on 8/8@ 1054. Heparin turned off at that time. Restarted heparin gtt per protocol once aPTT falls below 80 seconds.   HL 0.24 (prior to alteplase, at 2000 units/hr)- subtherapeutic aPTT 58 - below threshold to restart heparin infusion   Goal of Therapy:  Heparin level 0.3-0.7 units/ml aPTT 66-102  Monitor platelets by anticoagulation protocol: Yes   Plan:  Restart heparin infusion s/p alteplase Heparin at 2200 units/hr 6 hour HL/aPTT at 2000 Daily HL , CBC   Calton Dach, PharmD Clinical Pharmacist 10/25/2022 2:03 PM

## 2022-10-25 NOTE — Progress Notes (Signed)
VASCULAR LAB    Bilateral lower extremity venous duplex has been performed.  See CV proc for preliminary results.   Nieko Clarin, RVT 10/25/2022, 1:33 PM

## 2022-10-25 NOTE — Progress Notes (Signed)
NAME:  Jon Garcia, MRN:  [id number], DOB:  February 13, 1986, LOS: 1 ADMISSION DATE:  10/24/2022, CONSULTATION DATE:  10/24/2022 REFERRING MD: Emmit Alexanders - EDP, CHIEF COMPLAINT:  Submassive PE   History of Present Illness:  37 year old man who presented to [facility] 8/7 for dyspnea on exertion x 1 month with decreased exercise tolerance. Reports shortness of breath and chest pain radiating to the neck worsened 4 days prior to admission.  Also reported episode of hemoptysis on date of admission. PMHx significant for tobacco/vape use.  On ED arrival, patient was tachypneic, tachycardic to 120s, and hypertensive.  Given concern for PE, CTA Chest was obtained revealing extensive saddle pulmonary embolus with significant clot burden and CT evidence of right heart strain, RV to LV ratio 1.03.  Labs were notable for Trop 50 > 54. BNP 65. WBC 12.2, Hgb 17.9, Plt 180. BMP WNL. LA 1.3. Hypercoagulability workup pending.  Given large clot burden with saddle component and possible need for intervention, PCCM consulted for ICU transfer and further management.  On arrival to [facility], patient anxious but overall well-appearing. Denies fever/chills, no current CP. Endorses mild SOB/chest pressure/palpitations. Denies nausea/vomiting, changes in bowel/urinary habits, no sick contacts. No leg swelling. Endorses tobacco and vape use and states after this incident he has decided to stop. Denies recent travel/sedentary periods, no recent surgeries, no personal or family history of blood clots that he is aware. Mother previously smoked and had lung CA. 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  Pertinent Medical History:  Tobacco/vape use  Significant Hospital Events: Including procedures, antibiotic start and stop dates in addition to other pertinent events   8/7 - Presented to [facility] with dyspnea, chest pain, and hemoptysis. CTA Chest demonstrated large saddle PE with significant clot  burden, RV/LV ratio 1.03. IR made aware, will manage conservatively for now. PCCM consulted for transfer to [facility] for further management. 8/8 no acute issues overnight, remains on 2 L nasal cannula with continued complaints of pleuritic chest pain on deep inspiration  Interim History / Subjective:  Seen lying in bed in no acute distress Spouse updated at bedside  Objective:  Blood pressure 104/77, pulse 99, temperature 98.4 F (36.9 C), temperature source Oral, resp. rate 20, height 6' (1.829 m), weight 134.3 kg, SpO2 93%.        Intake/Output Summary (Last 24 hours) at 10/25/2022 0753 Last data filed at 10/25/2022 0700 Gross per 24 hour  Intake 337.48 ml  Output 500 ml  Net -162.52 ml   Filed Weights   10/24/22 1634 10/24/22 1800 10/25/22 0705  Weight: 134.3 kg 134.3 kg 134.3 kg   Physical Examination: General: No acute ill-appearing adult male lying in bed in no acute distress HEENT: [place]/AT, MM pink/moist, PERRL,  Neuro: And oriented x 3, nonfocal CV: s1s2 regular rate and rhythm, no murmur, rubs, or gallops,  PULM: Clear to auscultation bilaterally, no increased work of breathing, no added breath sounds GI: soft, bowel sounds active in all 4 quadrants, non-tender, non-distended, tolerating oral diet Extremities: warm/dry, no edema  Skin: no rashes or lesions  Resolved Hospital Problem List:     Assessment & Plan:  Acute saddle PE, large clot burden -Admitted with large saddle pulmonary embolus. CTA demonstrating extensive PE with saddle embolus with significant clot burden with evidence of mild R heart strain (RV/LV Ratio 1.03). Management options including conservative management, lytic administration and thrombectomy discussed with decision for initial conservative management and further workup, given hemodynamic stability. -  IR aware of patient, no urgent indication for intervention; will f/u pending studies and revisit possible thrombectomy/thrombolysis 8/8 if clinically  indicated P: Continue heparin drip Follow echocardiogram and lower extremity Dopplers Hypercoagulable workup pending Eventual transition to p.o. anticoagulation with a minimum AC x 3 months  Tobacco use Vape use P: Cessation education provided  Continue to monitor closely in ICU until echocardiogram completed.  If echocardiogram negative for any acute right heart strain can likely transition to oral anticoagulation and transfer to floor  Best Practice (right click and "Reselect all SmartList Selections" daily)   Diet/type: Regular consistency (see orders) and NPO at midnight DVT prophylaxis: systemic heparin GI prophylaxis: PPI Lines: N/A Foley:  N/A Code Status:  full code Last date of multidisciplinary goals of care discussion: Patient updated at bedside 8/7PM  Signature:  Rhapsody Wolven D. Harris, NP-C [facility] Personal contact information can be found on Amion  If no contact or response made please call [phone] 10/25/2022, 7:56 AM

## 2022-10-25 NOTE — Progress Notes (Deleted)
ANTICOAGULATION CONSULT NOTE  Pharmacy Consult for heparin Indication: pulmonary embolus  No Known Allergies  Patient Measurements: Height: 6' (182.9 cm) Weight: 134.3 kg (296 lb 1.2 oz) IBW/kg (Calculated) : 77.6 Heparin Dosing Weight: 108.2 kg   Vital Signs: Temp: 98.9 F (37.2 C) (08/08 1111) Temp Source: Axillary (08/08 1111) BP: 144/94 (08/08 1119) Pulse Rate: 114 (08/08 1119)  Labs: Recent Labs    10/24/22 1636 10/24/22 1836 10/25/22 0027 10/25/22 1029  HGB 17.9*  --  16.8 16.3  HCT 51.6  --  49.2 48.8  PLT 180  --  168 155  HEPARINUNFRC  --   --  0.18*  --   CREATININE 1.06  --  1.25*  --   TROPONINIHS 50* 54*  --   --     Estimated Creatinine Clearance: 113.7 mL/min (A) (by C-G formula based on SCr of 1.25 mg/dL (H)).   Medical History: Past Medical History:  Diagnosis Date   Tobacco use    Vapes nicotine containing substance       Assessment: 37 yo male presenting with chest pain, SOB, and hemoptysis. CT positive for pulmonary emboli. Hgb 17.9, plt 180, no signs of bleeding noted. Pharmacy consulted to dose heparin for PE.   Alteplase 50mg  IV  x1 given on 8/8@ 1054. Heparin turned off at that time. Restarted heparin gtt per protocol once aPTT falls below 80 seconds.   aPTT 58 - below threshold to restart heparin infusion   Goal of Therapy:  Heparin level 0.3-0.7 units/ml aPTT 66-102  Monitor platelets by anticoagulation protocol: Yes   Plan:  Restart heparin infusion s/p alteplase Heparin at 2200 units/hr 6 hour HL/aPTT at 2000 Daily HL , CBC   Calton Dach, PharmD Clinical Pharmacist 10/25/2022 11:26 AM

## 2022-10-25 NOTE — Progress Notes (Signed)
ANTICOAGULATION CONSULT NOTE  Pharmacy Consult for heparin Indication: pulmonary embolus  No Known Allergies  Patient Measurements: Height: 6' (182.9 cm) Weight: 134.3 kg (296 lb 1.2 oz) IBW/kg (Calculated) : 77.6 Heparin Dosing Weight: 108.2 kg   Vital Signs: Temp: 98.4 F (36.9 C) (08/08 1931) Temp Source: Oral (08/08 1931) BP: 130/81 (08/08 2000) Pulse Rate: 101 (08/08 2000)  Labs: Recent Labs    10/24/22 1636 10/24/22 1836 10/25/22 0027 10/25/22 1029 10/25/22 1037 10/25/22 1315 10/25/22 2036  HGB 17.9*  --  16.8 16.3  --   --   --   HCT 51.6  --  49.2 48.8  --   --   --   PLT 180  --  168 155  --   --   --   APTT  --   --   --  51*  --  58* 66*  LABPROT  --   --   --  14.6  --   --   --   INR  --   --   --  1.1  --   --   --   HEPARINUNFRC  --   --  0.18*  --  0.24*  --  0.25*  CREATININE 1.06  --  1.25*  --   --   --   --   TROPONINIHS 50* 54*  --   --   --   --   --     Estimated Creatinine Clearance: 113.7 mL/min (A) (by C-G formula based on SCr of 1.25 mg/dL (H)).   Medical History: Past Medical History:  Diagnosis Date   Tobacco use    Vapes nicotine containing substance       Assessment: 37 yo male presenting with chest pain, SOB, and hemoptysis. CT positive for pulmonary emboli. Hgb 17.9, plt 180, no signs of bleeding noted. Pharmacy consulted to dose heparin for PE.   Alteplase 50mg  IV  x1 given on 8/8@ 1054. Heparin turned off at that time. Restarted heparin gtt per protocol once aPTT falls below 80 seconds.   Heparin level subtherapeutic (0.24) on infusion at 2200 units/hr. No issues with line or bleeding reported per RN.  Goal of Therapy:  Heparin level 0.3-0.5 units/ml (until 8/9 1100 - 24h post alteplase) then back to 0.3-0.7 units/ml Monitor platelets by anticoagulation protocol: Yes   Plan:  Increase heparin to 2400 units/hr F/u 6 hr heparin level  Christoper Fabian, PharmD, BCPS Please see amion for complete clinical pharmacist phone  list 10/25/2022 9:37 PM

## 2022-10-26 ENCOUNTER — Telehealth: Payer: Self-pay | Admitting: Internal Medicine

## 2022-10-26 ENCOUNTER — Other Ambulatory Visit (HOSPITAL_COMMUNITY): Payer: Self-pay

## 2022-10-26 DIAGNOSIS — I2609 Other pulmonary embolism with acute cor pulmonale: Secondary | ICD-10-CM

## 2022-10-26 MED ORDER — APIXABAN 5 MG PO TABS: 5.0000 mg | ORAL_TABLET | Freq: Two times a day (BID) | ORAL | Status: DC

## 2022-10-26 MED ORDER — APIXABAN 5 MG PO TABS
10.0000 mg | ORAL_TABLET | Freq: Two times a day (BID) | ORAL | Status: DC
  Administered 2022-10-26 – 2022-10-27 (×3): 10 mg via ORAL
  Filled 2022-10-26 (×3): qty 2

## 2022-10-26 NOTE — TOC CM/SW Note (Signed)
Transition of Care [facility]) - Inpatient Brief Assessment   Patient Details  Name: Jon Garcia MRN: [id number] Date of Birth: Jun 25, 1985  Transition of Care [facility]) CM/SW Contact:    Tom-Johnson, Hershal Coria, RN Phone Number: 10/26/2022, 3:55 PM   Clinical Narrative:  Patient presented to the ED with worsening SOB, Chest Pain and  Hemoptysis. Found to have Pulmonary Embolism. IR consulted,  manage conservatively for now, no intervention at this time. On Heparin gtt, will transition to Oral anticoagulation at discharge.   From home with Fiance and Step daughter. Self employed as a Optician, dispensing. Independent with care and drive self prior to admission. Has five supportive siblings.  Does not have a PCP, New Patient establishment and hospital f/u scheduled at [facility], info on AVS.  Patient does not have Medical Insurance and will be discharging home on Oral Anticoagulation.  MATCH will be done at discharge.  Patient uses [facility] on [address].   CM will continue to follow as patient progresses with care towards discharge.        Transition of Care Asessment: Insurance and Status: Insurance coverage has been reviewed Patient has primary care physician: No (New Patient establishment at [facility]) Home environment has been reviewed: Yes Prior level of function:: Independent Prior/Current Home Services: No current home services Social Determinants of Health Reivew: SDOH reviewed no interventions necessary Readmission risk has been reviewed: Yes Transition of care needs: transition of care needs identified, TOC will continue to follow

## 2022-10-26 NOTE — Discharge Instructions (Addendum)
Information on my medicine - ELIQUIS (apixaban)  This medication education was reviewed with me or my healthcare representative as part of my discharge preparation.    Why was Eliquis prescribed for you? Eliquis was prescribed to treat blood clots that may have been found in the veins of your legs (deep vein thrombosis) or in your lungs (pulmonary embolism) and to reduce the risk of them occurring again.  What do You need to know about Eliquis ? The starting dose is 10 mg (two 5 mg tablets) taken TWICE daily for the FIRST SEVEN (7) DAYS, then on 11/02/22 the dose is reduced to ONE 5 mg tablet taken TWICE daily.  Eliquis may be taken with or without food.   Try to take the dose about the same time in the morning and in the evening. If you have difficulty swallowing the tablet whole please discuss with your pharmacist how to take the medication safely.  Take Eliquis exactly as prescribed and DO NOT stop taking Eliquis without talking to the doctor who prescribed the medication.  Stopping may increase your risk of developing a new blood clot.  Refill your prescription before you run out.  After discharge, you should have regular check-up appointments with your healthcare provider that is prescribing your Eliquis.    What do you do if you miss a dose? If a dose of ELIQUIS is not taken at the scheduled time, take it as soon as possible on the same day and twice-daily administration should be resumed. The dose should not be doubled to make up for a missed dose.  Important Safety Information A possible side effect of Eliquis is bleeding. You should call your healthcare provider right away if you experience any of the following: Bleeding from an injury or your nose that does not stop. Unusual colored urine (red or dark brown) or unusual colored stools (red or black). Unusual bruising for unknown reasons. A serious fall or if you hit your head (even if there is no bleeding).  Some medicines  may interact with Eliquis and might increase your risk of bleeding or clotting while on Eliquis. To help avoid this, consult your healthcare provider or pharmacist prior to using any new prescription or non-prescription medications, including herbals, vitamins, non-steroidal anti-inflammatory drugs (NSAIDs) and supplements.  This website has more information on Eliquis (apixaban): http://www.eliquis.com/eliquis/home

## 2022-10-26 NOTE — Progress Notes (Signed)
ANTICOAGULATION CONSULT NOTE  Pharmacy Consult for heparin Indication: pulmonary embolus  No Known Allergies  Patient Measurements: Height: 6' (182.9 cm) Weight: 132 kg (291 lb 0.1 oz) IBW/kg (Calculated) : 77.6 Heparin Dosing Weight: 108.2 kg   Vital Signs: Temp: 98.1 F (36.7 C) (08/09 0714) Temp Source: Oral (08/09 0714) BP: 105/68 (08/09 0600) Pulse Rate: 89 (08/09 0600)  Labs: Recent Labs    10/24/22 1636 10/24/22 1636 10/24/22 1836 10/25/22 0027 10/25/22 1029 10/25/22 1037 10/25/22 1315 10/25/22 2036 10/26/22 0358  HGB 17.9*  --   --  16.8 16.3  --   --   --  15.2  HCT 51.6  --   --  49.2 48.8  --   --   --  44.5  PLT 180  --   --  168 155  --   --   --  136*  APTT  --   --   --   --  51*  --  58* 66*  --   LABPROT  --   --   --   --  14.6  --   --   --   --   INR  --   --   --   --  1.1  --   --   --   --   HEPARINUNFRC  --    < >  --  0.18*  --  0.24*  --  0.25* 0.33  CREATININE 1.06  --   --  1.25*  --   --   --   --   --   TROPONINIHS 50*  --  54*  --   --   --   --   --   --    < > = values in this interval not displayed.    Estimated Creatinine Clearance: 112.7 mL/min (A) (by C-G formula based on SCr of 1.25 mg/dL (H)).   Medical History: Past Medical History:  Diagnosis Date   Tobacco use    Vapes nicotine containing substance       Assessment: 37 yo male presenting with chest pain, SOB, and hemoptysis. CT positive for pulmonary emboli. Pharmacy consulted to dose heparin.   Alteplase 50mg  IV  x1 given on 8/8@ 1054. Heparin turned off at that time. Restarted heparin gtt per protocol once aPTT falls below 80 seconds.   AM update - Heparin level therapeutic (0.33) at bottom of range. Hg wnl, plt down to 136. No issues with line or bleeding reported per RN.  Goal of Therapy:  Heparin level 0.3-0.5 units/ml (until 8/9 1100 - 24hrs post-alteplase) then back to 0.3-0.7 units/ml Monitor platelets by anticoagulation protocol: Yes   Plan:   Increase heparin slightly to 2450 units/hr to ensure stays in range 6 hr heparin level to confirm Monitor daily CBC, s/sx bleeding F/u long-term anticoagulation plan   Leia Alf, PharmD, BCPS Please check AMION for all [facility] contact numbers Clinical Pharmacist 10/26/2022 7:22 AM

## 2022-10-26 NOTE — Telephone Encounter (Signed)
Needs follow up in 3 months after echocardiogram (ordered) with APP or ND.

## 2022-10-26 NOTE — Plan of Care (Signed)

## 2022-10-26 NOTE — Telephone Encounter (Signed)
Patient scheduled 01/21/2023 at 11am with Dr. Celine Mans. Reminder mailed to address on file. Nothing further needed.

## 2022-10-26 NOTE — Progress Notes (Signed)
NAME:  Jon Garcia, MRN:  [id number], DOB:  01/10/1986, LOS: 2 ADMISSION DATE:  10/24/2022, CONSULTATION DATE:  10/24/22 REFERRING MD:  Emmit Alexanders, EDP, CHIEF COMPLAINT:  submassive PE   History of Present Illness:  37 year old man who presented to [facility] 8/7 for dyspnea on exertion x 1 month with decreased exercise tolerance. Reports shortness of breath and chest pain radiating to the neck worsened 4 days prior to admission.  Also reported episode of hemoptysis on date of admission. PMHx significant for tobacco/vape use.   On ED arrival, patient was tachypneic, tachycardic to 120s, and hypertensive.  Given concern for PE, CTA Chest was obtained revealing extensive saddle pulmonary embolus with significant clot burden and CT evidence of right heart strain, RV to LV ratio 1.03.  Labs were notable for Trop 50 > 54. BNP 65. WBC 12.2, Hgb 17.9, Plt 180. BMP WNL. LA 1.3. Hypercoagulability workup pending.   Given large clot burden with saddle component and possible need for intervention, PCCM consulted for ICU transfer and further management.   On arrival to [facility], patient anxious but overall well-appearing. Denies fever/chills, no current CP. Endorses mild SOB/chest pressure/palpitations. Denies nausea/vomiting, changes in bowel/urinary habits, no sick contacts. No leg swelling. Endorses tobacco and vape use and states after this incident he has decided to stop. Denies recent travel/sedentary periods, no recent surgeries, no personal or family history of blood clots that he is aware. Mother previously smoked and had lung CA. 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  Pertinent  Medical History  Tobacco and vape use  Significant Hospital Events: Including procedures, antibiotic start and stop dates in addition to other pertinent events   8/7 - Presented to [facility] with dyspnea, chest pain, and hemoptysis. CTA Chest demonstrated large saddle PE with significant  clot burden, RV/LV ratio 1.03. IR made aware, will manage conservatively for now. PCCM consulted for transfer to [facility] for further management. 8/8 no acute issues overnight, remains on 2 L nasal cannula with continued complaints of pleuritic chest pain on deep inspiration  Interim History / Subjective:  Doing well this morning. Pain and SOB have improved greatly. He has been able to eat and drink more. He is mobilizing well on his own. No LLE pain.  Objective   Blood pressure 115/75, pulse 87, temperature 98.1 F (36.7 C), temperature source Oral, resp. rate 20, height 6' (1.829 m), weight 132 kg, SpO2 95%.        Intake/Output Summary (Last 24 hours) at 10/26/2022 0731 Last data filed at 10/26/2022 0600 Gross per 24 hour  Intake 931 ml  Output 750 ml  Net 181 ml   Filed Weights   10/24/22 1800 10/25/22 0705 10/26/22 0500  Weight: 134.3 kg 134.3 kg 132 kg    Examination: General: Sitting up in bed, NAD HENT: NCAT, EOM grossly intact Lungs: CTAB, no m/r/g Cardiovascular: RRR without m/r/g Abdomen: Soft, nontender Neuro: Alert and oriented, able to answer questions appropriately, moving all extremities and mobilizing well  Assessment & Plan:  Acute saddle PE, large clot burden Admitted with CTA evidence of large saddle pulmonary embolus with mild R heart strain (RV/LV Ratio 1.03), PESI score high risk for 30 day mortality at 108 points. Initially managed conservatively given overall reassuring appearance; however, discussed and proceeded with half-dose alteplase 8/8. Echo returned with normal EF, mild LVH, RV/RA mildly enlarged. DVT US with acute DVT of left common femoral, left femoral, left popliteal, left posterior tibial, left peroneal,  and left gastrocnemius veins. P: Continue heparin drip, s/p alteplase once Hypercoagulable workup pending Eventual transition to p.o. anticoagulation indefinitely given unprovoked PE status Will need echo at 3 months (around January 24, 2023)    Tobacco use Vape use P: Cessation education previously provided   Given stable status without evidence of significant, hemodynamically unstable right heart strain on echo, can transfer to floor tomorrow, 8/10. Will sign out to [facility].  Best Practice (right click and "Reselect all SmartList Selections" daily)   Diet/type: Regular consistency (see orders) DVT prophylaxis: systemic heparin GI prophylaxis: PPI Lines: N/A Foley:  N/A Code Status:  full code Last date of multidisciplinary goals of care discussion: Patient updated at bedside 8/7PM  Labs   CBC: Recent Labs  Lab 10/24/22 1636 10/25/22 0027 10/25/22 1029 10/26/22 0358  WBC 12.2* 12.9* 11.4* 9.4  HGB 17.9* 16.8 16.3 15.2  HCT 51.6 49.2 48.8 44.5  MCV 85.1 87.9 87.6 88.1  PLT 180 168 155 136*    Basic Metabolic Panel: Recent Labs  Lab 10/24/22 1636 10/25/22 0027  NA 138 134*  K 4.1 3.5  CL 100 101  CO2 25 20*  GLUCOSE 105* 132*  BUN 15 15  CREATININE 1.06 1.25*  CALCIUM 9.7 8.7*  MG 2.1 2.1  PHOS  --  3.8   Coagulation Profile: Recent Labs  Lab 10/25/22 1029  INR 1.1   Critical care time:     Janeal Holmes, MD

## 2022-10-27 ENCOUNTER — Other Ambulatory Visit: Payer: Self-pay | Admitting: Nurse Practitioner

## 2022-10-27 DIAGNOSIS — I2692 Saddle embolus of pulmonary artery without acute cor pulmonale: Secondary | ICD-10-CM

## 2022-10-27 DIAGNOSIS — I824Y2 Acute embolism and thrombosis of unspecified deep veins of left proximal lower extremity: Secondary | ICD-10-CM

## 2022-10-27 MED ORDER — APIXABAN 5 MG PO TABS: ORAL_TABLET | ORAL | 0 refills | Status: DC

## 2022-10-27 NOTE — Hospital Course (Signed)
37 year old male with past medical history of smoking presented to the hospital with dyspnea for a month and decreased exercise tolerance with some chest discomfort.  In the ED patient was tachycardic tachypneic and hypertensive.  CTA of the chest was done which showed extensive saddle pulmonary embolus with significant clot burden and CT evidence of right heart strain.   RV to LV ratio 1.03.  Labs were notable for Trop 50 > 54. BNP 65. WBC 12.2, Hgb 17.9, Plt 180. BMP WNL. LA 1.3. Given large clot burden with saddle component and possible need for intervention, PCCM was consulted for ICU transfer and further management.  Assessment and plan.  Acute saddle PE, large clot burden  Admitted with CTA evidence of large saddle pulmonary embolus with mild R heart strain (RV/LV Ratio 1.03), PESI score high risk for 30 day mortality at 108 points.  Patient subsequently underwent half-dose alteplase 8/8. Echo returned with normal EF, mild LVH, RV/RA mildly enlarged. DVT US with acute DVT of left common femoral, left femoral, left popliteal, left posterior tibial, left peroneal, and left gastrocnemius veins.  Was continued on heparin drip.  Hypercoagulable workup was sent.  Patient will likely need indefinite anticoagulation due to unprovoked PE status and repeat echo in 3 months.  At this time anticoagulation has been changed to Eliquis.   Tobacco use Vape use Cessation advised.

## 2022-10-27 NOTE — Discharge Summary (Signed)
Physician Discharge Summary         Patient ID: Jon Garcia MRN: [id number] DOB/AGE: [birth year] 37 y.o.  Admit date: 10/24/2022 Discharge date: 10/27/2022  Discharge Diagnoses:    Active Hospital Problems   Diagnosis Date Noted   Pulmonary embolism [facility]) 10/24/2022    Resolved Hospital Problems  No resolved problems to display.      Discharge summary    37 year old man who presented to [facility] 8/7 for dyspnea on exertion x 1 month with decreased exercise tolerance. Reports shortness of breath and chest pain radiating to the neck worsened 4 days prior to admission.  Also reported episode of hemoptysis on date of admission. PMHx significant for tobacco/vape use.   On ED arrival, patient was tachypneic, tachycardic to 120s, and hypertensive.  Given concern for PE, CTA Chest was obtained revealing extensive saddle pulmonary embolus with significant clot burden and CT evidence of right heart strain, RV to LV ratio 1.03.  Labs were notable for Trop 50 > 54. BNP 65. WBC 12.2, Hgb 17.9, Plt 180. BMP WNL. LA 1.3. Hypercoagulability workup ordered.  Given large clot burden with saddle component, high PESI score, and possible need for intervention, PCCM consulted for ICU transfer and further management.  On arrival to [facility], patient anxious but overall well-appearing. Denies fever/chills, no current CP. Endorses mild SOB/chest pressure/palpitations. Denies nausea/vomiting, changes in bowel/urinary habits, no sick contacts. No leg swelling. Endorses tobacco and vape use and states after this incident he has decided to stop. Denies recent travel/sedentary periods, no recent surgeries, no personal or family history of blood clots that he is aware. Mother previously smoked and had lung CA. 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  Given high PESI score, tachycardia, and hypoxemia and after risk/ benefit discussion, patient elected to proceed with half  dose systemic thrombolysis on 8/8 with improvement in HR, hemodynamics, and pain.  Heparin resumed per pharmacy and transitioned to eliquis 8/9.  Remains hemodynamically stable, no complaints, and normoxia with no desaturations on ambulation around ICU 8/10, to be discharged home with follow-up with FPTS to establish w/ PCP.     Discharge Plan by Active Problems    Acute saddle PE, large clot burden Admitted with CTA evidence of large saddle pulmonary embolus with mild R heart strain (RV/LV Ratio 1.03), PESI score high risk for 30 day mortality at 108 points. - Initially managed conservatively given overall reassuring appearance; however, discussed and proceeded with half-dose alteplase 8/8 due to high PESI score, pain, tachycardia, and hypoxemia - TTE 8/8 returned with normal EF, mild LVH, RV/RA mildly enlarged. - DVT US with acute DVT of left common femoral, left femoral, left popliteal, left posterior tibial, left peroneal, and left gastrocnemius veins. P: - continue eliquis 10mg  BID for seven days total (started 8/9), then 5mg  BID.  Voucher given for one month supply per pharmacy.  - hypercoagulable panel> factor 5 leiden, lupus anticoagulant, prothrombin gene mutation, protein S total and activity,   pending.  Cardiolipin ab, Homcysteine, antrithrombin III, beta-2 glyco IgG neg.  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  Consider hematology evaluation.  - advised no strenuous activity for at least 1 month - remains hemodynamically and asymptomatic for discharge home.  No desaturations/ SOB  on ambulation.  - F/u at [facility] pulmonary office on 11/4/204 with Dr. Celine Mans.      Tobacco  use Vape use P: - Cessation education provided.   Pt relays more of a behavioral component, has been ok in hospital.   Social Needs - pt uninsured.  Has qualified for medicare per pt/ fiance and has  follow-up to see FPTS, Dr. Mliss Sax on 8/16 to establish with PCP   Significant Hospital tests/ studies  8/7 CTA PE 8/8 TTE 8/8 LE dupplex  Procedures    Culture data/antimicrobials   8/7  MRSA PCR neg   Consults     Discharge Exam: BP 122/81   Pulse 86   Temp 98.5 F (36.9 C) (Oral)   Resp (!) 22   Ht 6' (1.829 m)   Wt 132 kg   SpO2 97%   BMI 39.47 kg/m   General:  Pleasant obese male sitting in bed in NAD Neuro: alert and oriented, appropriate, MAE CV: rr, no murmur PULM:  non labored, CTA GI: soft, bs+, NT Extremities: warm/dry, slight LLE edema - NT Skin: no rashes   Labs at discharge   Lab Results  Component Value Date   CREATININE 1.25 (H) 10/25/2022   BUN 15 10/25/2022   NA 134 (L) 10/25/2022   K 3.5 10/25/2022   CL 101 10/25/2022   CO2 20 (L) 10/25/2022   Lab Results  Component Value Date   WBC 7.5 10/27/2022   HGB 14.4 10/27/2022   HCT 43.2 10/27/2022   MCV 86.4 10/27/2022   PLT 146 (L) 10/27/2022   Lab Results  Component Value Date   ALT 18 09/18/2009   AST 22 09/18/2009   ALKPHOS 63 09/18/2009   BILITOT 1.1 09/18/2009   Lab Results  Component Value Date   INR 1.1 10/25/2022    Current radiological studies    No results found.  Disposition:    Discharge disposition: 01-Home or Self Care     Home  Discharge Instructions     Increase activity slowly   Complete by: As directed    No strenuous activity       Allergies as of 10/27/2022   No Known Allergies      Medication List     TAKE these medications    apixaban 5 MG Tabs tablet Commonly known as: ELIQUIS Take two pills twice a day till 8/15, then on 8/16 decrease to one pill twice daily by mouth.         Follow-up appointment   FPTS- Dr. Mliss Sax on 8/16 PCCM- 11/4/204 with Dr. Celine Mans.  Discharge Condition:    stable   Time spent in discharge 35 mins     Posey Boyer, MSN, NP, AG-ACNP-BC [facility] 10/27/2022, 2:24  PM  See Amion for pager If no response to pager , please call [phone] until 7pm After 7:00 pm call Elink  [phone]

## 2022-11-02 ENCOUNTER — Encounter: Payer: Self-pay | Admitting: Family Medicine

## 2022-11-02 ENCOUNTER — Ambulatory Visit (INDEPENDENT_AMBULATORY_CARE_PROVIDER_SITE_OTHER): Payer: Medicaid Other | Admitting: Family Medicine

## 2022-11-02 ENCOUNTER — Other Ambulatory Visit: Payer: Self-pay

## 2022-11-02 VITALS — BP 143/99 | HR 89 | Ht 72.0 in | Wt 295.0 lb

## 2022-11-02 DIAGNOSIS — I2692 Saddle embolus of pulmonary artery without acute cor pulmonale: Secondary | ICD-10-CM

## 2022-11-02 DIAGNOSIS — N179 Acute kidney failure, unspecified: Secondary | ICD-10-CM

## 2022-11-02 DIAGNOSIS — Z720 Tobacco use: Secondary | ICD-10-CM

## 2022-11-02 DIAGNOSIS — F17201 Nicotine dependence, unspecified, in remission: Secondary | ICD-10-CM | POA: Insufficient documentation

## 2022-11-02 DIAGNOSIS — F17200 Nicotine dependence, unspecified, uncomplicated: Secondary | ICD-10-CM | POA: Insufficient documentation

## 2022-11-02 HISTORY — DX: Acute kidney failure, unspecified: N17.9

## 2022-11-02 NOTE — Progress Notes (Signed)
    SUBJECTIVE:   CHIEF COMPLAINT / HPI:   Patient was recently hospitalized from 10-24-2022 through 10-27-2022 for acute LLE DVT and unprovoked acute saddle PE.  He is seen here today for follow-up from hospitalization.  Concerns for follow-up: -Continued anticoagulation with a DOAC - has Eliquis, taking as instructed -Closely monitor electrolytes - BMP today -Patient pulmonary follow-up -scheduled with Dr. Celine Mans 01/21/2023  Reports he is feeling much better.  Denies headache, dizziness, chest pain or pressure, shortness of breath, abdominal pain, lower extremity pain or tenderness.  He reports he has completely stopped vaping.  He does very occasionally smoke marijuana, but plans to quit this.  He is not smoking cigarettes.  He is hoping to continue to improve his health and become more physically fit.  He is very motivated and has a supportive fianc.  He does ask when he can increase his levels of physical activity.  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   PERTINENT  PMH / PSH:  Past Medical History:  Diagnosis Date   Tobacco use    Vapes nicotine containing substance      OBJECTIVE:   BP (!) 143/99   Pulse 89   Ht 6' (1.829 m)   Wt 295 lb (133.8 kg)   SpO2 100%   BMI 40.01 kg/m   General: Very pleasant, well-appearing, no acute distress Cardio: Regular rate, regular rhythm, no murmurs on exam. Pulm: Clear, no wheezing, no crackles. No increased work of breathing. Abdominal: soft, non-tender, non-distended Extremities: no LE peripheral edema, tenderness, or erythema bilaterally Neuro: alert and oriented, speech normal in content. Psych:  Cognition and judgment appear intact. Alert, communicative and cooperative.  ASSESSMENT/PLAN:   Pulmonary embolism (HCC) Patient found to have unprovoked acute saddle PE, as well as acute LLE DVT. Discharged from hospital 1 week ago on 8/10. -On Eliquis at this time.  Taking 5mg  pill twice  daily. Continue at this time. -Will bring in paperwork for [place] to fill out to help him obtain refills of his Eliquis. -Advised patient that he was safe to do mild to moderate physical activity such as carrying his camera equipment and helping with household tasks.  Advised against any strenuous activity that would raise his heart rate and make it difficult for him to breathe.  Patient states understanding and agreement.   Tobacco abuse Patient was previously vaping.  He has completely stopped this since discharge from the hospital.  He reports some mild cravings currently, but does not require nicotine patches etc. -Congratulated patient on cessation and encouraged continued cessation.  Discussed the difficulty of forming new habits to replace habit of smoking, and encouraged patient to continue his efforts.  AKI (acute kidney injury) [facility]) Patient with increased creatinine in hospital.  Good hydration since discharge, no issues with oral intake of fluids -Recheck BMP today to monitor   Patient may be a good candidate for Wegovy in the future pending his insurance status.  He is highly motivated to improve his physical health and I believe this may help him.  Follow-up in the next 4 to 6 weeks with Dr. Phineas Real who will be the patient's PCP.  Cyndia Skeeters, DO [facility]

## 2022-11-02 NOTE — Patient Instructions (Signed)
Dear Jon Garcia  Today we discussed the following concerns and plans:  Continued anticoagulation with Eliquis. If you realize you will run out before you get your next refill, please call our office to make an appointment so we can help you with this. Do not run out of this medicine!  You will get some blood work done today. I will call you if any of your results are abnormal.  Please make an appointment to follow up with Dr. Phineas Real in the next 4-6 weeks.  If you have any concerns, please call the clinic or schedule an appointment.  It was a pleasure to take care of you today. Be well!  Cyndia Skeeters, DO [facility], PGY-1

## 2022-11-02 NOTE — Assessment & Plan Note (Addendum)
Patient found to have unprovoked acute saddle PE, as well as acute LLE DVT. Discharged from hospital 1 week ago on 8/10. -On Eliquis at this time.  Taking 5mg  pill twice daily. Continue at this time. -Will bring in paperwork for [place] to fill out to help him obtain refills of his Eliquis. -Advised patient that he was safe to do mild to moderate physical activity such as carrying his camera equipment and helping with household tasks.  Advised against any strenuous activity that would raise his heart rate and make it difficult for him to breathe.  Patient states understanding and agreement.

## 2022-11-02 NOTE — Assessment & Plan Note (Signed)
Patient with increased creatinine in hospital.  Good hydration since discharge, no issues with oral intake of fluids -Recheck BMP today to monitor

## 2022-11-02 NOTE — Assessment & Plan Note (Addendum)
Patient was previously vaping.  He has completely stopped this since discharge from the hospital.  He reports some mild cravings currently, but does not require nicotine patches etc. -Congratulated patient on cessation and encouraged continued cessation.  Discussed the difficulty of forming new habits to replace habit of smoking, and encouraged patient to continue his efforts.

## 2022-11-03 LAB — BASIC METABOLIC PANEL
BUN/Creatinine Ratio: 13 (ref 9–20)
BUN: 15 mg/dL (ref 6–20)
CO2: 24 mmol/L (ref 20–29)
Calcium: 9.7 mg/dL (ref 8.7–10.2)
Chloride: 101 mmol/L (ref 96–106)
Creatinine, Ser: 1.12 mg/dL (ref 0.76–1.27)
Glucose: 73 mg/dL (ref 70–99)
Potassium: 5 mmol/L (ref 3.5–5.2)
Sodium: 140 mmol/L (ref 134–144)
eGFR: 86 mL/min/{1.73_m2} (ref >59)

## 2022-11-06 ENCOUNTER — Telehealth: Payer: Self-pay | Admitting: Family Medicine

## 2022-11-06 ENCOUNTER — Telehealth: Payer: Self-pay

## 2022-11-06 NOTE — Telephone Encounter (Signed)
Significant other dropped off form at front desk for [employer].  Verified that patient section of form has been completed.  Last DOS/WCC with PCP was 11/02/22.  Placed form in green team folder to be completed by clinical staff.  Vilinda Blanks

## 2022-11-06 NOTE — Telephone Encounter (Signed)
Aaron Edelman, the significant other for pt is calling and asking if we will write a note stating that the pt was in the hospital from 10/24/2022- 10/27/2022. Her work is giving her a hard time about missing work and she needs to get paid for the time off. Please give her a call at [phone] to discuss what the letter needs to say.  Sunday Spillers, CMA

## 2022-11-06 NOTE — Telephone Encounter (Cosign Needed)
Discussed with Aaron Edelman about work excuse note needed since Mr. Strahan was in the hospital. Confirmed she can receive through MyChart. Will send letter now.

## 2022-11-08 NOTE — Telephone Encounter (Signed)
Reviewed form and placed in PCP's box for completion.  .Michelle R Simpson, CMA  

## 2022-11-12 ENCOUNTER — Telehealth: Payer: Self-pay | Admitting: Family Medicine

## 2022-11-12 NOTE — Telephone Encounter (Signed)
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 Placed in RN triage box for patient pickup.

## 2022-11-13 NOTE — Telephone Encounter (Signed)
Patient called and informed that forms are ready for pick up. Copy made and placed in batch scanning. Original placed at front desk for pick up.   Hannah C Pipkin, RN  

## 2022-11-23 ENCOUNTER — Other Ambulatory Visit: Payer: Self-pay

## 2022-11-23 MED ORDER — APIXABAN 5 MG PO TABS: 5.0000 mg | ORAL_TABLET | Freq: Two times a day (BID) | ORAL | 3 refills | Status: DC

## 2022-11-26 MED ORDER — APIXABAN 5 MG PO TABS: 5.0000 mg | ORAL_TABLET | Freq: Two times a day (BID) | ORAL | 3 refills | Status: DC

## 2022-11-26 NOTE — Addendum Note (Signed)
Addended by: Veronda Prude on: 11/26/2022 08:33 AM   Modules accepted: Orders

## 2022-11-26 NOTE — Telephone Encounter (Signed)
Patient calls nurse line regarding prescription. Prescription was sent to "no print" on 9/6.  Pended the prescription to [facility]. Forwarding to provider to update dispense quantity and refills.   Veronda Prude, RN

## 2022-12-07 ENCOUNTER — Other Ambulatory Visit: Payer: Self-pay

## 2022-12-07 ENCOUNTER — Ambulatory Visit (INDEPENDENT_AMBULATORY_CARE_PROVIDER_SITE_OTHER): Payer: Medicaid Other | Admitting: Family Medicine

## 2022-12-07 ENCOUNTER — Encounter: Payer: Self-pay | Admitting: Family Medicine

## 2022-12-07 VITALS — BP 140/102 | HR 82 | Ht 72.0 in | Wt 296.2 lb

## 2022-12-07 DIAGNOSIS — K219 Gastro-esophageal reflux disease without esophagitis: Secondary | ICD-10-CM | POA: Insufficient documentation

## 2022-12-07 DIAGNOSIS — F17291 Nicotine dependence, other tobacco product, in remission: Secondary | ICD-10-CM | POA: Diagnosis not present

## 2022-12-07 DIAGNOSIS — Z713 Dietary counseling and surveillance: Secondary | ICD-10-CM | POA: Diagnosis not present

## 2022-12-07 DIAGNOSIS — I10 Essential (primary) hypertension: Secondary | ICD-10-CM | POA: Insufficient documentation

## 2022-12-07 DIAGNOSIS — Z6841 Body Mass Index (BMI) 40.0 and over, adult: Secondary | ICD-10-CM | POA: Insufficient documentation

## 2022-12-07 DIAGNOSIS — Z86711 Personal history of pulmonary embolism: Secondary | ICD-10-CM

## 2022-12-07 DIAGNOSIS — Z23 Encounter for immunization: Secondary | ICD-10-CM | POA: Diagnosis not present

## 2022-12-07 MED ORDER — OMEPRAZOLE 20 MG PO CPDR: 20.0000 mg | DELAYED_RELEASE_CAPSULE | Freq: Every day | ORAL | 3 refills | Status: DC

## 2022-12-07 MED ORDER — LOSARTAN POTASSIUM-HCTZ 50-12.5 MG PO TABS: 1.0000 | ORAL_TABLET | Freq: Every day | ORAL | 1 refills | Status: DC

## 2022-12-07 MED ORDER — NICOTINE 14 MG/24HR TD PT24: 14.0000 mg | MEDICATED_PATCH | Freq: Every day | TRANSDERMAL | 0 refills | Status: DC

## 2022-12-07 NOTE — Assessment & Plan Note (Signed)
Motivated to remain quit.  Given moderate amount of reported vaping, will start with 14 mg nicotine patches daily.  Advised to remove 1 to 2 hours before bedtime if interrupting sleep/causing vivid dreams.  Follow-up efficacy.

## 2022-12-07 NOTE — Patient Instructions (Signed)
Keep taking your eliquis. Try to limit sweets. Add more fiber and protein. We can discuss wegovy as desired. I will send in omeprazole for GERD. I will send in 14 mg nicotine patches. Remove these before bed if they interrupt your sleep. I will start a new blood pressure medication today. Take this daily. Come back in 1 week to have labs done and see how you do.

## 2022-12-07 NOTE — Progress Notes (Signed)
    SUBJECTIVE:   CHIEF COMPLAINT / HPI:   History of PE Has been taking eliquis. Has follow up with pulm on 01/21/2023.  No shortness of breath.  Does have some chest pain intermittently.  The pain feels like when his muscles hurt from lying on his side for too long.  Lifestyle changes Interested in [place] potentially in the future, but would like more information on other lifestyle changes. Not vaping anymore.  Would like to try nicotine supplementation.  He does smoke marijuana occasionally.  GERD Used to take omeprazole and needs a new prescription.  PERTINENT  PMH / PSH: Abdominal pain, low back pain  OBJECTIVE:   BP (!) 140/102   Pulse 82   Ht 6' (1.829 m)   Wt 296 lb 3.2 oz (134.4 kg)   SpO2 100%   BMI 40.17 kg/m   General: Alert and oriented, in NAD Skin: Warm, dry, and intact HEENT: NCAT, EOM grossly normal, midline nasal septum Cardiac: RRR, no m/r/g appreciated Respiratory: CTAB, breathing and speaking comfortably on RA Abdominal: Soft, nontender, nondistended, normoactive bowel sounds Extremities: Moves all extremities grossly equally Neurological: No gross focal deficit Psychiatric: Appropriate mood and affect   ASSESSMENT/PLAN:   History of pulmonary embolism Symptoms improved on Eliquis twice daily.  He has follow-up with pulmonology coming up.  Advised to continue Eliquis.  Suspect chest pain MSK in nature given benign exam.  Nicotine dependence in remission Motivated to remain quit.  Given moderate amount of reported vaping, will start with 14 mg nicotine patches daily.  Advised to remove 1 to 2 hours before bedtime if interrupting sleep/causing vivid dreams.  Follow-up efficacy.  Encounter for weight loss counseling Interested in weight loss strategies.  Discussed increasing protein and fiber intake.  Discussed limiting sweets.  Gradually increase activity; he is already walking daily.  Can revisit Wegovy in the future as desired given patient like to  think more about this.  GERD (gastroesophageal reflux disease) Will refill omeprazole.  Hypertension Patient has now had multiple elevated BP readings.  Discussed starting losartan-HCTZ.  Patient amenable.  Will follow-up in 1 week and collect BMP at that time.   Health maintenance He obtained his COVID and flu vaccines today without incident.  Please collect hepatitis C screening along with BMP at next visit.  Janeal Holmes, MD [facility]

## 2022-12-07 NOTE — Assessment & Plan Note (Signed)
Interested in weight loss strategies.  Discussed increasing protein and fiber intake.  Discussed limiting sweets.  Gradually increase activity; he is already walking daily.  Can revisit Wegovy in the future as desired given patient like to think more about this.

## 2022-12-07 NOTE — Assessment & Plan Note (Addendum)
Patient has now had multiple elevated BP readings.  Discussed starting losartan-HCTZ.  Patient amenable.  Will follow-up in 1 week and collect BMP at that time.

## 2022-12-07 NOTE — Assessment & Plan Note (Signed)
Will refill omeprazole.

## 2022-12-07 NOTE — Assessment & Plan Note (Addendum)
Symptoms improved on Eliquis twice daily.  He has follow-up with pulmonology coming up.  Advised to continue Eliquis.  Suspect chest pain MSK in nature given benign exam.

## 2022-12-14 ENCOUNTER — Other Ambulatory Visit: Payer: Self-pay

## 2022-12-14 ENCOUNTER — Encounter: Payer: Self-pay | Admitting: Family Medicine

## 2022-12-14 ENCOUNTER — Ambulatory Visit (INDEPENDENT_AMBULATORY_CARE_PROVIDER_SITE_OTHER): Payer: Medicaid Other | Admitting: Family Medicine

## 2022-12-14 VITALS — BP 138/90 | HR 78 | Ht 72.0 in | Wt 293.6 lb

## 2022-12-14 DIAGNOSIS — Z86711 Personal history of pulmonary embolism: Secondary | ICD-10-CM | POA: Diagnosis not present

## 2022-12-14 DIAGNOSIS — Z713 Dietary counseling and surveillance: Secondary | ICD-10-CM

## 2022-12-14 DIAGNOSIS — I10 Essential (primary) hypertension: Secondary | ICD-10-CM

## 2022-12-14 DIAGNOSIS — Z1159 Encounter for screening for other viral diseases: Secondary | ICD-10-CM | POA: Diagnosis not present

## 2022-12-14 NOTE — Progress Notes (Signed)
SUBJECTIVE:   CHIEF COMPLAINT / HPI:  Jon Garcia is a 37 y.o. male with a pertinent past medical history of GERD, HTN, and PE in August 2024 now on Eliquis, presenting to the clinic for follow up on hypertension and lifestyle changes.  Hypertension BP: (!) 138/90 today. Home medications include: Losartan-hydrochlorothiazide 50-12.5 mg once daily (started 9/20) He endorses taking these medications as prescribed. Does not check blood pressure at home.  Has wrist cuff, will bring it in to get calibrated. BMP due. Denies hypotension symptoms including dizziness and orthostatic symptoms. Feels like he has anxiety when coming to doctor's office.  "Almost died" from PE because he did not want to go to office. Did drink coffee and use Zyn this morning 30ish minutes prior to appointment  Nicotine dependence in remission Last visit, started on 14 mg nicotine patches daily.  At the time, had just quit vaping. Today, he reports he has not started yet.  Normal pharmacy did not have these in stock. Has been using Zyn for nicotine, no vaping.  Weight loss Last visit, patient had started considering taking Wegovy but was still hesitant. Diet: "Not good," readily available food and cheaper food is less healthy.  Fiancee loves sweets and always has them around the house.  Trying to get rid of carbs. Works in [employer], now not on his feet as much.  Normal for him is 240 lbs, now 293. Exercise: Walking, trying to get 10,000 steps in daily with FitBit.  Will start going back to gym gradually, was worried about returning after his recent PE. Today, he did not want to start [facility].  Previously took weight loss medication, had side effects.  History of pulmonary embolism PE in August 2024 requiring ICU stay. Still taking Eliquis daily. Denies bleeding, no blood in stool, no nosebleeds. No shortness of breath.  No chest pain.   PERTINENT PMH / PSH: Obesity, GERD, HTN, and PE in August  2024 now on Eliquis. Former smoker, then vaped, now just using Zyn.   OBJECTIVE:   BP (!) 138/90 (BP Location: Right Arm)   Pulse 78   Ht 6' (1.829 m)   Wt 293 lb 9.6 oz (133.2 kg)   SpO2 98%   BMI 39.82 kg/m   General: Age-appropriate, resting comfortably in chair, NAD, alert and at baseline. Cardiovascular: Regular rate and rhythm. Normal S1/S2. No murmurs, rubs, or gallops appreciated. 2+ radial pulses. Pulmonary: Clear bilaterally to ascultation. No increased WOB, no accessory muscle usage. No wheezes, crackles, or rhonchi. Abdominal: No tenderness to deep or light palpation. No rebound or guarding. No HSM. Extremities: No peripheral edema bilaterally. Capillary refill <2 seconds.   ASSESSMENT/PLAN:   Encounter for weight loss counseling Feels motivated to make lifestyle changes.  Still hesitant about starting Wegovy, open to revisiting conversation in 6 months to 1 year if no changes with lifestyle modification. Limited exercise and excess intake of sweets are notable culprits for this patient.  Discussed portion sizes, increasing fiber intake, not purchasing sweets so they are not available around the house.  Goal 10,000 steps per day with FitBit and return to gym for light exercise. -Continue to follow up, discuss progress on goals at follow up in 1 month  History of pulmonary embolism No PE symptoms, no bleeding on anticoagulation. -Continue Eliquis 5 mg -CBC ordered  Hypertension Not yet at goal <130/80, but improved from prior visit.  Given patient's morning use of nicotine and caffeine intake, suspect blood pressure is inaccurate.  Additionally, given patient's emphasis on experience of stress in doctor's office, whitecoat hypertension is a consideration as well. -Appointment with Dr. Raymondo Band for home BP cuff calibration scheduled -Blood pressure log for 1 month after calibration of cuff and return for follow up to interpret -Consider 24 hour BP cuff if inconsistencies  between home and clinic BP measures -BMP ordered  Health maintenance -Routine hepatitis C antibody  Return in about 5 weeks (around 01/18/2023) for lifestyle modification, blood pressure f/u.  Jon Bayless Sharion Dove, MD [facility]

## 2022-12-14 NOTE — Assessment & Plan Note (Addendum)
Feels motivated to make lifestyle changes.  Still hesitant about starting Wegovy, open to revisiting conversation in 6 months to 1 year if no changes with lifestyle modification. Limited exercise and excess intake of sweets are notable culprits for this patient.  Discussed portion sizes, increasing fiber intake, not purchasing sweets so they are not available around the house.  Goal 10,000 steps per day with FitBit and return to gym for light exercise. -Continue to follow up, discuss progress on goals at follow up in 1 month

## 2022-12-14 NOTE — Assessment & Plan Note (Signed)
Not yet at goal <130/80, but improved from prior visit.  Given patient's morning use of nicotine and caffeine intake, suspect blood pressure is inaccurate.  Additionally, given patient's emphasis on experience of stress in doctor's office, whitecoat hypertension is a consideration as well. -Appointment with Dr. Raymondo Band for home BP cuff calibration scheduled -Blood pressure log for 1 month after calibration of cuff and return for follow up to interpret -Consider 24 hour BP cuff if inconsistencies between home and clinic BP measures -BMP ordered

## 2022-12-14 NOTE — Patient Instructions (Signed)
It was great to see you today! Thank you for choosing [facility] for your primary care.  Today we addressed:  1. Hypertension Please keep taking your losartan-hydrochlorothiazide. Bring your blood pressure cuff to Dr. Raymondo Band on 10/1 at 1:30 pm to get it calibrated. Please keep a log of your blood pressures at home for a month after calibration. We will check your metabolic labs today.  2. Hepatitis C screening test We recommend hepatitis C testing once in your lifetime.  3. History of pulmonary embolism Please keep taking your Eliquis.  We will check your blood count today.  4. Lifestyle changes Please work on getting in 10,000 steps daily, start going back to the gym with light exercise.  Reduce sweets in diet, ideally mostly cut them out.  Buying fewer sweets is the best way to not eat them.  You can replace carbs with healthier carbs (whole wheat breat, brown rice).   You will get a MyChart message or a letter if lab results are normal. Otherwise, you will get a call from [place].  You should return to our clinic in 5 weeks or sooner if you have any concerns.  Thank you for coming to see [place] at [facility] and for the opportunity to care for you! Joua Bake, MD 12/14/2022, 9:31 AM

## 2022-12-14 NOTE — Assessment & Plan Note (Signed)
No PE symptoms, no bleeding on anticoagulation. -Continue Eliquis 5 mg -CBC ordered

## 2022-12-15 LAB — BASIC METABOLIC PANEL
BUN/Creatinine Ratio: 15 (ref 9–20)
BUN: 15 mg/dL (ref 6–20)
CO2: 19 mmol/L — ABNORMAL LOW (ref 20–29)
Calcium: 9.6 mg/dL (ref 8.7–10.2)
Chloride: 103 mmol/L (ref 96–106)
Creatinine, Ser: 1.02 mg/dL (ref 0.76–1.27)
Glucose: 93 mg/dL (ref 70–99)
Potassium: 4.5 mmol/L (ref 3.5–5.2)
Sodium: 141 mmol/L (ref 134–144)
eGFR: 96 mL/min/{1.73_m2} (ref >59)

## 2022-12-15 LAB — CBC
Hematocrit: 51.7 % — ABNORMAL HIGH (ref 37.5–51.0)
Hemoglobin: 17.5 g/dL (ref 13.0–17.7)
MCH: 29.7 pg (ref 26.6–33.0)
MCHC: 33.8 g/dL (ref 31.5–35.7)
MCV: 88 fL (ref 79–97)
Platelets: 206 10*3/uL (ref 150–450)
RBC: 5.9 x10E6/uL — ABNORMAL HIGH (ref 4.14–5.80)
RDW: 12.9 % (ref 11.6–15.4)
WBC: 6.2 10*3/uL (ref 3.4–10.8)

## 2022-12-15 LAB — HEPATITIS C ANTIBODY: Hep C Virus Ab: NONREACTIVE

## 2022-12-18 ENCOUNTER — Ambulatory Visit (INDEPENDENT_AMBULATORY_CARE_PROVIDER_SITE_OTHER): Payer: Medicaid Other | Admitting: Pharmacist

## 2022-12-18 ENCOUNTER — Encounter: Payer: Self-pay | Admitting: Pharmacist

## 2022-12-18 VITALS — BP 137/87 | HR 91 | Wt 296.2 lb

## 2022-12-18 DIAGNOSIS — I10 Essential (primary) hypertension: Secondary | ICD-10-CM | POA: Diagnosis not present

## 2022-12-18 DIAGNOSIS — F17291 Nicotine dependence, other tobacco product, in remission: Secondary | ICD-10-CM | POA: Diagnosis not present

## 2022-12-18 MED ORDER — APIXABAN 5 MG PO TABS: 5.0000 mg | ORAL_TABLET | Freq: Two times a day (BID) | ORAL | 3 refills | Status: DC

## 2022-12-18 NOTE — Assessment & Plan Note (Signed)
Hypertension diagnosed recently and initiated on low-dose combination therapy comes for calibration/correlation determination of how wrist BP monitor. Currently reports taking losartan/hydrochlorothiazide.  BP goal < 130/80  mmHg. Medication adherence appears good.  -Continued losartan/hydrochlorothiazide 50/12.5mg  -Patient educated on purpose, proper use, and potential adverse effects.  -F/u labs ordered - BMET today.  -Counseled on lifestyle modifications for blood pressure control including reduced dietary sodium, increased exercise, adequate sleep. -Encouraged patient to check BP at home with help of manual readings by RN and not the wrist monitor. Asked to bring log of readings to next visit.  -We tentatively planned next visit could include 24 hour BP monitoring.

## 2022-12-18 NOTE — Assessment & Plan Note (Signed)
Nicotine use disorder - using 7-8 pouches of Modern Oral Nicotine pouches, - Zyn (6mg  per pouch) daily.  Continues to be high level nicotine dependent at this time. Consider discussion of tapering/quitting at future visits.  Encouraged continued abstinence from smoking/vaping if possible.

## 2022-12-18 NOTE — Progress Notes (Signed)
S:     Chief Complaint  Patient presents with   Medication Management    Blood Pressure    37 y.o. male who presents for hypertension evaluation, education, and management.  PMH is significant for history of PE and nicotine use disorder.  Patient was referred and last seen by Primary Care Provider, Dr. Sharion Dove, on 12/14/2022.   At last visit, losartan / hydrochlorothiazide was initiated.   Today, patient arrives in good spirits and presents without assistance. Denies dizziness, headache, blurred vision, swelling.   Patient reports hypertension was diagnosed recently.   Family/Social history: Father has hypertension - possibly resistant hypertension  Medication adherence reported as good . Patient has taken BP medications within the last 24 hours.   Current antihypertensives include: losartan/hydrochlorothiazide 50/12.5mg  daily  Antihypertensives tried in the past include: None  Reported home BP readings: lower than in office however has minimal number of checks.   He brings wrist BP monitor to the office to determine correlation with office devices.  He has not used the wrist monitor often at the time of the visit.  He is concerned it is reading high.   Office reading were obtained with automated device and the wrist device.   Unfortunately the wrist device appeared to register ~ 30 points higher systolic.  Likely the wrist device would not be adequate for long-term monitoring.   Patient reports "mother-in-law" can perform BP assessments manually and he would like to obtain more out of office readings prior to changing therapy.   Reports minimal "added-salt" in diet   O:  Review of Systems  All other systems reviewed and are negative.   Physical Exam Constitutional:      Appearance: Normal appearance.  Pulmonary:     Effort: Pulmonary effort is normal.  Neurological:     Mental Status: He is alert.  Psychiatric:        Mood and Affect: Mood normal.         Behavior: Behavior normal.        Thought Content: Thought content normal.        Judgment: Judgment normal.     Last 3 Office BP readings: BP Readings from Last 3 Encounters:  12/18/22 137/87  12/14/22 (!) 138/90  12/07/22 (!) 140/102    BMET    Component Value Date/Time   NA 141 12/14/2022 1223   K 4.5 12/14/2022 1223   CL 103 12/14/2022 1223   CO2 19 (L) 12/14/2022 1223   GLUCOSE 93 12/14/2022 1223   GLUCOSE 132 (H) 10/25/2022 0027   BUN 15 12/14/2022 1223   CREATININE 1.02 12/14/2022 1223   CALCIUM 9.6 12/14/2022 1223   GFRNONAA >60 10/25/2022 0027   GFRAA  09/18/2009 1244    >60        The eGFR has been calculated using the MDRD equation. This calculation has not been validated in all clinical situations. eGFR's persistently <60 mL/min signify possible Chronic Kidney Disease.    Renal function: Estimated Creatinine Clearance: 139.3 mL/min (by C-G formula based on SCr of 1.02 mg/dL).  Clinical ASCVD: No  The ASCVD Risk score (Arnett DK, et al., 2019) failed to calculate for the following reasons:   The 2019 ASCVD risk score is only valid for ages 52 to 39  Patient is participating in a Managed Medicaid Plan:  Yes    A/P: Hypertension diagnosed recently and initiated on low-dose combination therapy comes for calibration/correlation determination of how wrist BP monitor. Currently reports taking losartan/hydrochlorothiazide.  BP goal < 130/80  mmHg. Medication adherence appears good.  -Continued losartan/hydrochlorothiazide 50/12.5mg  -Patient educated on purpose, proper use, and potential adverse effects.  -F/u labs ordered - BMET today.  -Counseled on lifestyle modifications for blood pressure control including reduced dietary sodium, increased exercise, adequate sleep. -Encouraged patient to check BP at home with help of manual readings by RN and not the wrist monitor. Asked to bring log of readings to next visit.  -We tentatively planned next visit could  include 24 hour BP monitoring.   Nicotine use disorder - using 7-8 pouches of Modern Oral Nicotine pouches, - Zyn (6mg  per pouch) daily.  Continues to be high level nicotine dependent at this time. Consider discussion of tapering/quitting at future visits.  Encouraged continued abstinence from smoking/vaping if possible.    Results reviewed and written information provided.    Written patient instructions provided. Patient verbalized understanding of treatment plan.  Total time in face to face counseling 19 minutes.    Follow-up:  Pharmacist 10/11 for potential Amb BP monitor or medication change. Patient seen with Caprice Beaver, PharmD Candidate and Shona Simpson, PharmD Candidate.

## 2022-12-18 NOTE — Patient Instructions (Signed)
It was nice to see you today!  Your goal blood pressure is  <130/80 mmHg.  Medication Changes: Continue all other medication the same.   Monitor blood pressure at home daily and keep a log (on your phone or piece of paper) to bring with you to your next visit. Write down date, time, blood pressure and pulse.  Keep up the good work with diet and exercise. Aim for a diet full of vegetables, fruit and lean meats (chicken, [place], fish). Try to limit salt intake by eating fresh or frozen vegetables (instead of canned), rinse canned vegetables prior to cooking and do not add any additional salt to meals.

## 2022-12-19 LAB — BASIC METABOLIC PANEL
BUN/Creatinine Ratio: 13 (ref 9–20)
BUN: 13 mg/dL (ref 6–20)
CO2: 25 mmol/L (ref 20–29)
Calcium: 9.4 mg/dL (ref 8.7–10.2)
Chloride: 101 mmol/L (ref 96–106)
Creatinine, Ser: 1 mg/dL (ref 0.76–1.27)
Glucose: 85 mg/dL (ref 70–99)
Potassium: 4.1 mmol/L (ref 3.5–5.2)
Sodium: 141 mmol/L (ref 134–144)
eGFR: 99 mL/min/{1.73_m2} (ref >59)

## 2022-12-24 ENCOUNTER — Telehealth: Payer: Self-pay | Admitting: Pharmacist

## 2022-12-24 NOTE — Telephone Encounter (Signed)
Attempted to contact patient for follow-up of lab results (AR[facility] - Thaizide).  Normal results.    Left HIPAA compliant voice mail requesting call back to direct phone: [phone]  Total time with patient call and documentation of interaction: 3 minutes.  Follow-up phone call planned: None - patient visit planned in 5 days.  Follow-up more then if patient has questions.

## 2022-12-24 NOTE — Telephone Encounter (Signed)
Reviewed and agree with Dr Koval's plan.   

## 2022-12-24 NOTE — Progress Notes (Signed)
Reviewed and agree with Dr Koval's plan.   

## 2022-12-24 NOTE — Telephone Encounter (Signed)
-----   Message from [facility] McDiarmid sent at 12/24/2022  8:49 AM EDT -----  ----- Message ----- From: Nell Range Lab Results In Sent: 12/19/2022   8:15 AM EDT To: Leighton Roach McDiarmid, MD

## 2022-12-28 ENCOUNTER — Encounter: Payer: Self-pay | Admitting: Pharmacist

## 2022-12-28 ENCOUNTER — Ambulatory Visit: Payer: Medicaid Other | Admitting: Pharmacist

## 2022-12-28 VITALS — BP 129/99 | HR 81 | Wt 267.4 lb

## 2022-12-28 DIAGNOSIS — I10 Essential (primary) hypertension: Secondary | ICD-10-CM | POA: Diagnosis not present

## 2022-12-28 MED ORDER — AMLODIPINE-VALSARTAN-HCTZ 5-160-12.5 MG PO TABS: 1.0000 | ORAL_TABLET | Freq: Every day | ORAL | 11 refills | Status: DC

## 2022-12-28 NOTE — Patient Instructions (Addendum)
It was nice to see you today!  Your goal blood pressure is  <130/80  mmHg.  Medication Changes: START amlodipine-valsartan-hydrochlorothiazide 5-160-12.5 mg daily  Discontinue losartan-hydrochlorothiazide 50-12.5 mg daily once you finish your medication on hand (~8 pills)  Continue all other medication the same.   Monitor blood pressure at home daily and keep a log (on your phone or piece of paper) to bring with you to your next visit. Write down date, time, blood pressure and pulse.  Keep up the good work with diet and exercise. Aim for a diet full of vegetables, fruit and lean meats (chicken, [place], fish). Try to limit salt intake by eating fresh or frozen vegetables (instead of canned), rinse canned vegetables prior to cooking and do not add any additional salt to meals.   GOAL: decrease Zyn strength to 3 mg pouches, using ~10/day

## 2022-12-28 NOTE — Progress Notes (Signed)
S:     Chief Complaint  Patient presents with   Medication Management    Hypertension   37 y.o. male who presents for hypertension evaluation, education, and management.  PMH is significant for HTN, Tobacco Use Disorder.  Patient was referred and last seen by Primary Care Provider, Dr. Raymondo Band, on 12/18/2022.   At last visit, continued BP meds, discussed 24 hr BP monitoring, calibration of  wrist BP monitor.   Today, patient arrives in good spirits and presents without  assistance.  Denies dizziness, headache, blurred vision, swelling. Reports continued use of Zyn 6 mg pouches, but slight increase from 7-8/day to possibly 10/day when stressed. Not using nicotine patches, since he likes the mouth aspect of using pouches. No longer vaping.   Patient reports hypertension was diagnosed around a month ago.   Family/Social history: Dad has Hypertension  Medication adherence reported as good. Patient has taken BP medications today.   Current antihypertensives include: losartan-hydrochlorothiazide 50-12.5 mg daily (~ 8 pills remain)  Antihypertensives tried in the past include: None  Reported home BP readings: None  Patient reported dietary habits: Eats 3 meals/day - lots of carbs and drinking diet soda, coffee (w/ creamer), water  Patient-reported exercise habits: walking 25 mins when gets a chance, but hasn't much recently  ASCVD risk factors include: HTN  O:  Review of Systems  All other systems reviewed and are negative.   Physical Exam Vitals reviewed.  Constitutional:      Appearance: Normal appearance.  Pulmonary:     Effort: Pulmonary effort is normal.  Neurological:     Mental Status: He is alert.  Psychiatric:        Mood and Affect: Mood normal.        Behavior: Behavior normal.        Thought Content: Thought content normal.        Judgment: Judgment normal.    Last 3 Office BP readings: BP Readings from Last 3 Encounters:  12/28/22 (!) 129/99  12/18/22  137/87  12/14/22 (!) 138/90    BMET    Component Value Date/Time   NA 141 12/18/2022 1658   K 4.1 12/18/2022 1658   CL 101 12/18/2022 1658   CO2 25 12/18/2022 1658   GLUCOSE 85 12/18/2022 1658   GLUCOSE 132 (H) 10/25/2022 0027   BUN 13 12/18/2022 1658   CREATININE 1.00 12/18/2022 1658   CALCIUM 9.4 12/18/2022 1658   GFRNONAA >60 10/25/2022 0027   GFRAA  09/18/2009 1244    >60        The eGFR has been calculated using the MDRD equation. This calculation has not been validated in all clinical situations. eGFR's persistently <60 mL/min signify possible Chronic Kidney Disease.    Renal function: Estimated Creatinine Clearance: 134.7 mL/min (by C-G formula based on SCr of 1 mg/dL).  Clinical ASCVD: No  The ASCVD Risk score (Arnett DK, et al., 2019) failed to calculate for the following reasons:   The 2019 ASCVD risk score is only valid for ages 55 to 64  Patient is participating in a Managed Medicaid Plan:  Yes   A/P: Hypertension diagnosed ~1 month ago currently uncontrolled on current medications. BP goal < 130/80 mmHg. Medication adherence appears good. Control is suboptimal due to inadequate medication regimen. -Started amlodipine-valsartan-hydrochlorothiazide 5-160-12.5 mg daily once he finishes supply of losartan-hydrochlorothiazide.  Patient educated on purpose, proper use and potential adverse effects of low extremity swelling due to amlodipine.  -Discontinue losartan-hydrochlorothiazide 50-12.5 mg daily, once  Pt finishes ~8 pills on hand -Patient educated on purpose, proper use, and potential adverse effects of edema with amlodipine.  -Reevaluate Blood Pressure control at visit with Dr. Sharion Dove 10/31 -Plan to reconsider Amb BP monitoring again IF blood pressure remains uncontrolled at follow-up.  Chronic nicotine use disorder - denies smoking or vaping while using Zyn 6mg  pouches ~ 8 per day (10 when stressed).   -Agreed to attempt use of lower nicotine content Zyn  pouches and continue to keep quantity at 10 or fewer per day.  -Consider option of adding patch to assist with decreasing Zyn Nicotine pouches at future follow-up.  Results reviewed and written information provided.    Written patient instructions provided. Patient verbalized understanding of treatment plan.  Total time in face to face counseling 27 minutes.    Follow-up:  Pharmacist 01/28/2023 PCP clinic visit on 01/17/2023 with Dr. Sharion Dove.  Patient seen with Caprice Beaver, PharmD Candidate and Shona Simpson, PharmD Candidate.

## 2022-12-28 NOTE — Assessment & Plan Note (Signed)
Chronic nicotine use disorder - denies smoking or vaping while using Zyn 6mg  pouches ~ 8 per day (10 when stressed).   -Agreed to attempt use of lower nicotine content Zyn pouches and continue to keep quantity at 10 or fewer per day.  -Consider option of adding patch to assist with decreasing Zyn Nicotine pouches at future follow-up.

## 2022-12-28 NOTE — Assessment & Plan Note (Signed)
Hypertension diagnosed ~1 month ago currently uncontrolled on current medications. BP goal < 130/80 mmHg. Medication adherence appears good. Control is suboptimal due to inadequate medication regimen. -Started amlodipine-valsartan-hydrochlorothiazide 5-160-12.5 mg daily once he finishes supply of losartan-hydrochlorothiazide.  Patient educated on purpose, proper use and potential adverse effects of low extremity swelling due to amlodipine.  -Discontinue losartan-hydrochlorothiazide 50-12.5 mg daily, once Pt finishes ~8 pills on hand -Patient educated on purpose, proper use, and potential adverse effects of edema with amlodipine.  -Reevaluate Blood Pressure control at visit with Dr. Sharion Dove 10/31 -Plan to reconsider Amb BP monitoring again IF blood pressure remains uncontrolled at follow-up.

## 2023-01-01 NOTE — Progress Notes (Signed)
Reviewed and agree with Dr Koval's plan.   

## 2023-01-17 ENCOUNTER — Ambulatory Visit (INDEPENDENT_AMBULATORY_CARE_PROVIDER_SITE_OTHER): Payer: Medicaid Other | Admitting: Family Medicine

## 2023-01-17 VITALS — BP 130/80 | HR 92 | Wt 300.2 lb

## 2023-01-17 DIAGNOSIS — Z86711 Personal history of pulmonary embolism: Secondary | ICD-10-CM

## 2023-01-17 DIAGNOSIS — F17200 Nicotine dependence, unspecified, uncomplicated: Secondary | ICD-10-CM | POA: Diagnosis not present

## 2023-01-17 DIAGNOSIS — I10 Essential (primary) hypertension: Secondary | ICD-10-CM | POA: Diagnosis not present

## 2023-01-17 DIAGNOSIS — Z713 Dietary counseling and surveillance: Secondary | ICD-10-CM

## 2023-01-17 MED ORDER — WEGOVY 0.25 MG/0.5ML ~~LOC~~ SOAJ
0.2500 mg | SUBCUTANEOUS | 1 refills | Status: DC
Start: 2023-01-17 — End: 2023-01-25

## 2023-01-17 NOTE — Assessment & Plan Note (Addendum)
Stable on Eliquis, no signs of bleeding.  Likely will continue lifelong AC, given unprovoked nature of PE (considering cigarette smoking as possible risk factor, but unclear).  Joint decision making about decreasing to 2.5 mg at follow up. -Continue Eliquis 5 mg daily

## 2023-01-17 NOTE — Patient Instructions (Addendum)
It was great to see you today! Thank you for choosing [facility] for your primary care.  Today we addressed: Weight loss counseling I am prescribing Wegovy 0.25 mg weekly.  This will be a shot to your abdomen.  Please come to the clinic if you are having trouble administering it.  Please give [place] a call if you are having nausea, abdominal pain, vomiting.  Please be aware if you have some slight nausea, that will improve each time you administer the medicine.  We will let you know when you are approved.  Please continue to work on improving her diet. -Goals: STOP buying sweets, carbohydrate-rich foods to avoid temptation; decrease portion sizes by using fists and portioning methods, will get a smaller plate  Please continue to work on increasing exercise -Goal: Increase steps to 10,000 daily, brisk pace  Hypertension Your blood pressure is improving and it is great to see!  I think weight loss and decreasing your nicotine intake will help a lot.  We will check a metabolic panel today to make sure the medicine is treating you well.  Nicotine cessation Please keep trying to decrease your intake of Zyn to 5 times a day.   We are checking some labs today, including a metabolic panel.  You will get a MyChart message or a letter if results are normal. Otherwise, you will get a call from [place].  You should return to our clinic in 6 weeks to talk about increasing Wegovy or to further work on lifestyle improvement if not approved for the medicine yet.  Thank you for coming to see [place] at [facility] and for the opportunity to care for you! Sharion Dove, Neil Errickson, MD 01/17/2023, 2:52 PM

## 2023-01-17 NOTE — Progress Notes (Signed)
SUBJECTIVE:   CHIEF COMPLAINT / HPI:  Jon Garcia is a 37 y.o. male with a pertinent past medical history of GERD, HTN, and PE in August 2024 on Eliquis presenting to the clinic for HTN follow up and weight loss counseling.  Hypertension BP: 130/80 today. Home medications include: Amlodipine-valsartan-hydrochlorothiazide 5-160-12.5, started 1 week ago See diet and exercise entries below Patient has had a BMP in the past 1 year, needs repeat with changed med dose. Denies hypotension symptoms including dizziness and orthostatic symptoms. Denies headache, vision changes, LUQ pain, hematuria.  Nicotine cessation Now doing Zyn 3 mg, about 10-12 per day. Down from 6 mg 10 times a day at last visit with Dr. Raymondo Garcia 10/11. Continuing to work on decreasing intake, does not want medication assistance for quitting at this time.  Weight loss States he is struggling with weight despite efforts at home. Quit marijuana use and smoking, but has been unable to make significant weight loss changes. Wants to discuss Wegovy today.  Exercise Currently getting 5,000 steps daily and has been more "sluggish" the past 3 weeks. Previously, was at ~7,000 steps daily. Making an effort to go to the gym, but has not been able to go recently. Goal: Increase steps to 10,000 daily, brisk pace  Diet Has been trying to decrease carbohydrates, working on buying fewer unhealthy foods to avoid temptation Has cut out fast food, only eats it 4 times a month, down from 4 times a week Breakfast: Chicken crockpot leftovers, some protein; eggs, grits, pancakes Lunch: Similar to breakfast, sometimes a quick snack with crackers/cheese/ Dinner: Reducing dairy intake (cheese), tacos, reducing chips Goals: STOP buying sweets, carbohydrate-rich foods; decrease portion sizes by using fists and portioning methods, will use a physically smaller plate  History of pulmonary embolism PE in August 2024 requiring ICU  stay. Seemingly unprovoked PE.  Still taking Eliquis daily, tolerating well. Denies bleeding, hematochezia, no nosebleeds. No shortness of breath.  No chest pain.   PERTINENT PMH / PSH: Obesity, GERD, HTN, and PE in August 2024 now on Eliquis. Former smoker, then vaped, now just using Zyn (decreasing gradually).   OBJECTIVE:   BP 130/80   Pulse 92   Wt (!) 300 lb 3.2 oz (136.2 kg)   BMI 40.71 kg/m   General: Age-appropriate, large body habitus, resting comfortably in chair, NAD, alert and at baseline. Cardiovascular: Regular rate and rhythm. Normal S1/S2. No murmurs, rubs, or gallops appreciated. 2+ radial pulses. Pulmonary: Clear bilaterally to ascultation. No increased WOB, no accessory muscle usage on room air. No wheezes, crackles, or rhonchi. Skin: Warm and dry.  No petechiae or bruising grossly. Extremities: No peripheral edema bilaterally. No calf tenderness bilaterally.  Capillary refill <2 seconds.   ASSESSMENT/PLAN:   Assessment & Plan Encounter for weight loss counseling Patient attempting significant dietary interventions to lose weight as noted in HPI and working on increasing exercise as able given work schedule.  Would benefit from weight loss therapy with Wegovy. -Ordered Wegovy 0.25 mg/week starting dose, will await Prior Auth -Will follow up in 6 weeks to increase dose if PA approved and tolerating meds -Provided counseling on side effects per AVS -Diet goals: Portion control, avoid buying unhealth snacks, increase veggies/decrease carbs -Exercise goals: Increase step count and pace Hypertension, unspecified type Mildly elevated, nearly at goal.  Suspect weight loss and nicotine cessation will help with BP. -Continue amlodipine-valsartan-hydrochlorothiazide 5-160-12.5 -BMP today History of pulmonary embolism Stable on Eliquis, no signs of bleeding.  Likely will continue lifelong  AC, given unprovoked nature of PE (considering cigarette smoking as possible  risk factor, but unclear).  Joint decision making about decreasing to 2.5 mg at follow up. -Continue Eliquis 5 mg daily Nicotine use disorder Gradually decreasing intake. Goal to decrease Zyn to 3 mg 5 times daily from 10 times daily.  Return in about 6 weeks (around 02/28/2023), or Weight loss follow up, ONGEXB.  Jon Clouatre Sharion Dove, MD [facility]

## 2023-01-17 NOTE — Assessment & Plan Note (Addendum)
Mildly elevated, nearly at goal.  Suspect weight loss and nicotine cessation will help with BP. -Continue amlodipine-valsartan-hydrochlorothiazide 5-160-12.5 -BMP today

## 2023-01-17 NOTE — Assessment & Plan Note (Addendum)
Patient attempting significant dietary interventions to lose weight as noted in HPI and working on increasing exercise as able given work schedule.  Would benefit from weight loss therapy with Wegovy. -Ordered Wegovy 0.25 mg/week starting dose, will await Prior Auth -Will follow up in 6 weeks to increase dose if PA approved and tolerating meds -Provided counseling on side effects per AVS -Diet goals: Portion control, avoid buying unhealth snacks, increase veggies/decrease carbs -Exercise goals: Increase step count and pace

## 2023-01-17 NOTE — Assessment & Plan Note (Addendum)
Gradually decreasing intake. Goal to decrease Zyn to 3 mg 5 times daily from 10 times daily.

## 2023-01-18 LAB — BASIC METABOLIC PANEL
BUN/Creatinine Ratio: 14 (ref 9–20)
BUN: 15 mg/dL (ref 6–20)
CO2: 22 mmol/L (ref 20–29)
Calcium: 9.6 mg/dL (ref 8.7–10.2)
Chloride: 100 mmol/L (ref 96–106)
Creatinine, Ser: 1.11 mg/dL (ref 0.76–1.27)
Glucose: 80 mg/dL (ref 70–99)
Potassium: 4.4 mmol/L (ref 3.5–5.2)
Sodium: 138 mmol/L (ref 134–144)
eGFR: 87 mL/min/{1.73_m2} (ref >59)

## 2023-01-21 ENCOUNTER — Encounter: Payer: Self-pay | Admitting: Internal Medicine

## 2023-01-21 ENCOUNTER — Ambulatory Visit (INDEPENDENT_AMBULATORY_CARE_PROVIDER_SITE_OTHER): Payer: Medicaid Other | Admitting: Internal Medicine

## 2023-01-21 VITALS — BP 122/87 | HR 94 | Ht 72.0 in | Wt 303.0 lb

## 2023-01-21 DIAGNOSIS — I2602 Saddle embolus of pulmonary artery with acute cor pulmonale: Secondary | ICD-10-CM

## 2023-01-21 NOTE — Progress Notes (Signed)
         Jon Garcia    [id number]    10-13-1985  Primary Care Physician:Mabe, Earvin Hansen, MD Date of Appointment: 01/21/2023 Established Patient Visit  Chief complaint:   Chief Complaint  Patient presents with   Hospitalization Follow-up    ED 10/24/2022 Echo 10/25/22     HPI: Jon Garcia is a 37 y.o. man who presented to ED in August 2024 for acute pulmonary embolism with acute cor pulmonale. Also had DVT. Also has tobacco use disorder.  Interval Updates: Here for 3 month follow up. Treated with half dose systemic thrombolysis and discharged on eliquis therapy. Doing well, no adverse effects with eliquis. Back to work as Environmental manager. Quitting vaping. Working on losing weight. No chest pain or dyspnea.   I have reviewed the patient's family social and past medical history and updated as appropriate.   Past Medical History:  Diagnosis Date   Tobacco use    Vapes nicotine containing substance     Past Surgical History:  Procedure Laterality Date   APPENDECTOMY      No family history on file.  Social History   Occupational History   Not on file  Tobacco Use   Smoking status: Former    Types: Cigarettes   Smokeless tobacco: Current   Tobacco comments:    Zyn 3mg   7-8 pieces per day 12/2022  Substance and Sexual Activity   Alcohol use: Yes    Comment: occ   Drug use: No   Sexual activity: Not on file     Physical Exam: Blood pressure 122/87, pulse 94, height 6' (1.829 m), weight (!) 303 lb (137.4 kg), SpO2 98%.  Gen:      No acute distress, obese ENT:  no nasal polyps, mucus membranes moist Lungs:    No increased respiratory effort, symmetric chest wall excursion, clear to auscultation bilaterally, no wheezes or crackles CV:         Regular rate and rhythm; no murmurs, rubs, or gallops.  No pedal edema   Data Reviewed: Imaging: I have personally reviewed the CTPE study August 2024 = saddle pe with right heart strain  PFTs:      No data  to display         I have personally reviewed the patient's PFTs and   Labs: Thrombophilia work up reviewed - prothrombin gene mutation, factor V leiden negative.  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   Continue the eliquis for at least 6 months but I recommend life long because your risk for redeveloping clots.  Congratulations on quitting smoking!  Weight loss would be a good next step - dietary [facility] could be helpful.     Return to Care: Return in about 1 year (around 01/21/2024).   Durel Salts, MD Pulmonary and Critical Care Medicine [facility] [phone]

## 2023-01-21 NOTE — Patient Instructions (Signed)
It was a pleasure to see you today!  Please schedule follow up scheduled with myself in 1 year.  If my schedule is not open yet, we will contact you with a reminder closer to that time. Please call [phone] if you haven't heard from [place] a month before, and always call us sooner if issues or concerns arise. You can also send [place] a message through MyChart, but but aware that this is not to be used for urgent issues and it may take up to 5-7 days to receive a reply. Please be aware that you will likely be able to view your results before I have a chance to respond to them. Please give [place] 5 business days to respond to any non-urgent results.    Before your next visit I would like you to have: Echocardiogram - ultrasound of your heart.   Continue the eliquis for at least 6 months but I recommend life long because your risk for redeveloping clots.  Congratulations on quitting smoking!  Weight loss would be a good next step - dietary counseling at [facility] could be helpful.

## 2023-01-22 ENCOUNTER — Telehealth: Payer: Self-pay

## 2023-01-22 NOTE — Telephone Encounter (Signed)
Pharmacy Patient Advocate Encounter   Received notification from CoverMyMeds that prior authorization for [facility] is required/requested.   Insurance verification completed.   The patient is insured through [facility] MEDICAID .   Per test claim: PA required; PA submitted to above mentioned insurance via CoverMyMeds Key/confirmation #/EOC [id number]. Status is pending  Dx code will need to be changed to E66.9 for obesity.

## 2023-01-25 ENCOUNTER — Other Ambulatory Visit: Payer: Self-pay | Admitting: Family Medicine

## 2023-01-25 DIAGNOSIS — E66813 Obesity, class 3: Secondary | ICD-10-CM

## 2023-01-25 DIAGNOSIS — Z713 Dietary counseling and surveillance: Secondary | ICD-10-CM

## 2023-01-25 MED ORDER — WEGOVY 0.25 MG/0.5ML ~~LOC~~ SOAJ
0.2500 mg | SUBCUTANEOUS | 1 refills | Status: DC
Start: 2023-01-25 — End: 2023-02-25

## 2023-01-25 NOTE — Telephone Encounter (Signed)
Pharmacy Patient Advocate Encounter  Received notification from [facility] Medicaid that Prior Authorization for [facility] has been APPROVED from 01/22/23 to 07/16/23.  Please add DX code of Obesity to med.

## 2023-01-28 ENCOUNTER — Ambulatory Visit: Payer: Medicaid Other | Admitting: Pharmacist

## 2023-01-28 MED ORDER — WEGOVY 0.5 MG/0.5ML ~~LOC~~ SOAJ: 0.5000 mg | SUBCUTANEOUS | 1 refills | Status: DC

## 2023-01-28 NOTE — Telephone Encounter (Signed)
Patient contacted for follow-up of [facility] An prescription and supply issue.  Since last contact patient reports he has been unable to find the 0.25mg  starting strength.   Medication Samples have been provided for the patient pick-up  Drug name: Wegovy (semaglutide)       Strength: 0.25mg         Qty: 4 pens  LOT: [id number]  Exp.Date: 07/16/2024  Dosing instructions: Inject weekly  The patient has been instructed regarding the correct time, dose, and frequency of taking this medication, including desired effects and most common side effects.   Madelon Lips 10:14 AM 01/28/2023   New - next prescription for 0.5mg  weekly sent to preferred pharmacy.    Total time with patient call and documentation of interaction: 11 minutes. Phone contact conducted by Shona Simpson, PharmD Candidate.

## 2023-01-28 NOTE — Telephone Encounter (Signed)
Patient presents to clinic for samples.   Provided with sample per note from Dr. Raymondo Band.   Veronda Prude, RN

## 2023-01-28 NOTE — Addendum Note (Signed)
Addended by: Kathrin Ruddy on: 01/28/2023 10:19 AM   Modules accepted: Orders

## 2023-02-25 ENCOUNTER — Ambulatory Visit: Payer: Medicaid Other | Admitting: Family Medicine

## 2023-02-25 ENCOUNTER — Encounter: Payer: Self-pay | Admitting: Family Medicine

## 2023-02-25 VITALS — BP 117/77 | HR 78 | Ht 72.0 in | Wt 302.0 lb

## 2023-02-25 DIAGNOSIS — I10 Essential (primary) hypertension: Secondary | ICD-10-CM | POA: Diagnosis present

## 2023-02-25 DIAGNOSIS — F17200 Nicotine dependence, unspecified, uncomplicated: Secondary | ICD-10-CM | POA: Diagnosis not present

## 2023-02-25 DIAGNOSIS — Z86711 Personal history of pulmonary embolism: Secondary | ICD-10-CM | POA: Diagnosis not present

## 2023-02-25 DIAGNOSIS — Z6841 Body Mass Index (BMI) 40.0 and over, adult: Secondary | ICD-10-CM

## 2023-02-25 LAB — POCT GLYCOSYLATED HEMOGLOBIN (HGB A1C): Hemoglobin A1C: 5.4 % (ref 4.0–5.6)

## 2023-02-25 MED ORDER — WEGOVY 0.5 MG/0.5ML ~~LOC~~ SOAJ
0.5000 mg | SUBCUTANEOUS | 1 refills | Status: DC
Start: 2023-02-25 — End: 2023-11-27

## 2023-02-25 NOTE — Patient Instructions (Signed)
It was great to see you! I have refilled your Wegovy at 0.5 mg weekly.  Let me know how you do with this. Continue your blood pressure medication and lifestyle changes.  You are doing a great job! Continue your Eliquis.  Be sure to get your echocardiogram schedule whenever you are able! Let me know if you have any other questions.  I will see you back in 2 months.

## 2023-02-25 NOTE — Assessment & Plan Note (Signed)
Continue Eliquis 5 mg daily and following with pulmonology.  Patient to schedule echocardiogram soon.

## 2023-02-25 NOTE — Assessment & Plan Note (Signed)
Stable on Zyn 3 mg up to 10 times daily.  Discussed going down to 8 times daily.  Increased dose of semaglutide may help reduce cravings, as well.

## 2023-02-25 NOTE — Progress Notes (Signed)
    SUBJECTIVE:   CHIEF COMPLAINT / HPI:   Hypertension Has been taking amlodipine-valsartan-HCTZ 5-160-12.5 without dizziness, vision changes, lightheadedness.  Weight loss Has finished 1 month of Wegovy 0.25 mg weekly.  Has not felt like this has helped much with his weight.  Has not suppressed appetite very well.  No GI disturbance on this.  Has been working on cutting out fried foods and sweet foods.  Has also been exercising by riding his bike more.  History of pulmonary embolism Has been taking Eliquis 5 mg daily.  Followed up with pulmonology on 01/21/2023.  Has upcoming echo ordered by them for further evaluation of heart function (previously demonstrated mildly reduced RV function in the setting of acute PE).  Nicotine use Remains on Zyn 3 mg up to 10 times daily.  OBJECTIVE:   BP 117/77   Pulse 78   Ht 6' (1.829 m)   Wt (!) 302 lb (137 kg)   SpO2 100%   BMI 40.96 kg/m   General: Alert and oriented, in NAD Skin: Warm, dry, and intact HEENT: NCAT, EOM grossly normal, midline nasal septum Cardiac: Regular rate Respiratory: Breathing and speaking comfortably on RA Extremities: Moves all extremities grossly equally Neurological: No gross focal deficit Psychiatric: Appropriate mood and affect   ASSESSMENT/PLAN:   Hypertension Well-controlled on amlodipine-valsartan-HCTZ.  Continue current management.  Nicotine use disorder Stable on Zyn 3 mg up to 10 times daily.  Discussed going down to 8 times daily.  Increased dose of semaglutide may help reduce cravings, as well.  History of pulmonary embolism Continue Eliquis 5 mg daily and following with pulmonology.  Patient to schedule echocardiogram soon.  BMI 40.0-44.9, adult (HCC) Tolerating Wegovy 0.25 mg weekly well.  A1c today 5.4.  Will increase to Wegovy 0.5 mg weekly.  Continue dietary and exercise changes.  Can increase Wegovy dosing in 2 to 4 weeks as tolerated. Follow-up in 2 months.   Health  maintenance States he received his tetanus booster around 2020.  Janeal Holmes, MD [facility]

## 2023-02-25 NOTE — Assessment & Plan Note (Signed)
Tolerating Wegovy 0.25 mg weekly well.  A1c today 5.4.  Will increase to Wegovy 0.5 mg weekly.  Continue dietary and exercise changes.  Can increase Wegovy dosing in 2 to 4 weeks as tolerated. Follow-up in 2 months.

## 2023-02-25 NOTE — Assessment & Plan Note (Signed)
Well-controlled on amlodipine-valsartan-HCTZ.  Continue current management.

## 2023-02-26 ENCOUNTER — Ambulatory Visit (HOSPITAL_COMMUNITY): Payer: Medicaid Other

## 2023-04-16 ENCOUNTER — Other Ambulatory Visit: Payer: Self-pay | Admitting: Family Medicine

## 2023-04-26 ENCOUNTER — Ambulatory Visit (INDEPENDENT_AMBULATORY_CARE_PROVIDER_SITE_OTHER): Payer: Medicaid Other | Admitting: Family Medicine

## 2023-04-26 VITALS — BP 128/88 | HR 83 | Ht 72.0 in | Wt 292.6 lb

## 2023-04-26 DIAGNOSIS — I10 Essential (primary) hypertension: Secondary | ICD-10-CM | POA: Diagnosis not present

## 2023-04-26 DIAGNOSIS — K219 Gastro-esophageal reflux disease without esophagitis: Secondary | ICD-10-CM | POA: Diagnosis not present

## 2023-04-26 DIAGNOSIS — R0683 Snoring: Secondary | ICD-10-CM

## 2023-04-26 DIAGNOSIS — Z86711 Personal history of pulmonary embolism: Secondary | ICD-10-CM | POA: Diagnosis not present

## 2023-04-26 DIAGNOSIS — F17200 Nicotine dependence, unspecified, uncomplicated: Secondary | ICD-10-CM

## 2023-04-26 DIAGNOSIS — Z6841 Body Mass Index (BMI) 40.0 and over, adult: Secondary | ICD-10-CM | POA: Diagnosis not present

## 2023-04-26 MED ORDER — FAMOTIDINE 20 MG PO TABS: 20.0000 mg | ORAL_TABLET | Freq: Every day | ORAL | 0 refills | Status: DC

## 2023-04-26 NOTE — Assessment & Plan Note (Signed)
 Has now lost 10 pounds with lifestyle changes.  No longer on the Wegovy .  Congratulated patient on success.  Given elevated BMI along with snoring and other factors, patient has a STOP-BANG score 4.  Discussed sleep study, and he would really prefer a home study.  Will place referral to sleep medicine.  Could potentially discuss use of Zepbound if he is found to have OSA which would also help on his weight loss journey.

## 2023-04-26 NOTE — Assessment & Plan Note (Addendum)
 Near goal on current management.  Patient would like to hold off on pharmacologic management at this time given impressive lifestyle changes and weight loss.  Will await sleep study to see if OSA could be contributing.

## 2023-04-26 NOTE — Assessment & Plan Note (Signed)
 Continues on Eliquis  without issue.  Will send message to team to help him get scheduled for repeat echo since he had to reschedule prior.  Hopefully will see improvement in RV function.

## 2023-04-26 NOTE — Assessment & Plan Note (Signed)
 Discussed potential risk with long-term use of omeprazole .  He is amenable to trialing famotidine  daily (as opposed to only as needed) for his GERD.  Advised to let me know if he has recurrence of symptoms and would like to go back on omeprazole  after 1 week trial.

## 2023-04-26 NOTE — Assessment & Plan Note (Signed)
 Not ready to cut down right now.  Advised we are here to help when he is ready.

## 2023-04-26 NOTE — Patient Instructions (Signed)
 Keep up the great work on the weight loss journey! Let me know when you are ready to cut down on Zyn use! I have sent in a referral to sleep medicine to assess you for sleep apnea. I have also sent in famotidine  to take instead of omeprazole .  You can send me a message on MyChart if this does not work after trying it for about a week straight, and we can go back to the omeprazole . Our team will be in contact help schedule you for the echocardiogram. Otherwise, I will see you in 3 months!  Let me know if you need me before then.

## 2023-04-26 NOTE — Progress Notes (Signed)
    SUBJECTIVE:   CHIEF COMPLAINT / HPI:   HTN Continues on amlodipine -valsartan -hydrochlorothiazide without effects.  Weight loss Previously increased to 0.5 mg weekly.  However, he went off of it due to insurance concerns.  He has been able to lose weight without it, as he is down 10 pounds today.  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  History of PE Unable to get follow up echo at end of last year. Previous echo obtained during PE with mildly reduced RV function. Continues on eliquis  5 mg BID.  Nicotine  use Continues to use Zyn 3 mg though has been consistently around 10 of them daily.  He has not been able to go down just yet due to stress.  Sleep patterns Demonstrated elevated hematocrit and HTN over last couple of checks. Has not had sleep study in the past.  Notes he has been told he has loud snoring.  Does not endorse any gasping or choking in his sleep.  GERD Takes omeprazole . Has tried famotidine  use as needed in the past without much help.  OBJECTIVE:   BP 128/88   Pulse 83   Ht 6' (1.829 m)   Wt 292 lb 9.6 oz (132.7 kg)   SpO2 98%   BMI 39.68 kg/m   General: Alert and oriented, in NAD Skin: Warm, dry, and intact HEENT: NCAT, EOM grossly normal, midline nasal septum Cardiac: RRR, no m/r/g appreciated Respiratory: CTAB, breathing and speaking comfortably on RA Abdominal: Nondistended, normoactive bowel sounds Extremities: Moves all extremities grossly equally Neurological: No gross focal deficit Psychiatric: Appropriate mood and affect   ASSESSMENT/PLAN:   Hypertension Near goal on current management.  Patient would like to hold off on pharmacologic management at this time given impressive lifestyle changes and weight loss.  Will await sleep study to see if OSA could be contributing.   GERD (gastroesophageal reflux disease) Discussed potential risk with long-term use of omeprazole .  He is amenable to  trialing famotidine  daily (as opposed to only as needed) for his GERD.  Advised to let me know if he has recurrence of symptoms and would like to go back on omeprazole  after 1 week trial.  Nicotine  use disorder Not ready to cut down right now.  Advised we are here to help when he is ready.  History of pulmonary embolism Continues on Eliquis  without issue.  Will send message to team to help him get scheduled for repeat echo since he had to reschedule prior.  Hopefully will see improvement in RV function.  BMI 40.0-44.9, adult (HCC) Has now lost 10 pounds with lifestyle changes.  No longer on the Wegovy .  Congratulated patient on success.  Given elevated BMI along with snoring and other factors, patient has a STOP-BANG score 4.  Discussed sleep study, and he would really prefer a home study.  Will place referral to sleep medicine.  Could potentially discuss use of Zepbound if he is found to have OSA which would also help on his weight loss journey.   Health maintenance Patient politely declines COVID vaccination today.  Unfortunately no pneumococcal vaccinations in the office today.  Stuart Redo, MD [facility]

## 2023-05-13 ENCOUNTER — Encounter: Payer: Self-pay | Admitting: Family Medicine

## 2023-05-13 MED ORDER — OMEPRAZOLE 20 MG PO CPDR: 20.0000 mg | DELAYED_RELEASE_CAPSULE | Freq: Every day | ORAL | 3 refills | Status: DC

## 2023-05-31 ENCOUNTER — Other Ambulatory Visit: Payer: Self-pay | Admitting: Family Medicine

## 2023-05-31 NOTE — Addendum Note (Signed)
 Addended by: Evette Georges B on: 05/31/2023 11:43 AM   Modules accepted: Orders

## 2023-07-08 ENCOUNTER — Telehealth: Payer: Self-pay

## 2023-07-08 MED ORDER — APIXABAN 5 MG PO TABS: 5.0000 mg | ORAL_TABLET | Freq: Two times a day (BID) | ORAL | 3 refills | Status: DC

## 2023-07-08 NOTE — Telephone Encounter (Signed)
 Sent in new Rx for eliquis  per pharmacy request given new insurance. Will forward to pharmacy tech to help with PA for this medication.

## 2023-07-08 NOTE — Telephone Encounter (Signed)
 Pharmacy faxed New Rx Request form for Dr. Fredrik Jensen per patient's request about medication Eliquis . Patient has new insurance and medication requires a PA per what form states  Placed form in PCP's box  Christ Courier, CMA

## 2023-07-09 ENCOUNTER — Other Ambulatory Visit (HOSPITAL_COMMUNITY): Payer: Self-pay

## 2023-07-09 ENCOUNTER — Telehealth: Payer: Self-pay

## 2023-07-09 NOTE — Telephone Encounter (Signed)
 Pharmacy Patient Advocate Encounter   Received notification from Physician's Office that prior authorization for eliquis  is required/requested.   Insurance verification completed.   The patient is insured through [employer] .   PA required; PA submitted to above mentioned insurance via Fax Key/confirmation #/[id number] - Status is pending  Form completed from [place] site as directed by plan. Faxed to [phone] (phone [phone])

## 2023-07-10 NOTE — Telephone Encounter (Signed)
 Pharmacy Patient Advocate Encounter  Received notification from [facility] Medicaid that Prior Authorization for ELIQUIS  has been APPROVED from 07/09/23 to 03/18/24  (Unsure why [facility] Medicaid responded to PA)  PA #/Case ID/Reference #: [id number]

## 2023-07-11 ENCOUNTER — Ambulatory Visit: Admitting: Family Medicine

## 2023-08-20 ENCOUNTER — Other Ambulatory Visit (HOSPITAL_COMMUNITY): Payer: Self-pay

## 2023-09-18 ENCOUNTER — Other Ambulatory Visit (HOSPITAL_COMMUNITY): Payer: Self-pay

## 2023-09-19 ENCOUNTER — Encounter: Payer: Self-pay | Admitting: Family Medicine

## 2023-09-19 ENCOUNTER — Telehealth: Payer: Self-pay

## 2023-09-19 ENCOUNTER — Other Ambulatory Visit (HOSPITAL_COMMUNITY): Payer: Self-pay

## 2023-09-19 NOTE — Telephone Encounter (Signed)
 Patient fiance called regarding this PA. Told her the PA was sent to insurance and we are awaiting approval. Patient asked what [facility] is because they use [facility]. Patient down to 1 pill. Please call patient with an update and to inform her of what is going on.

## 2023-09-19 NOTE — Telephone Encounter (Signed)
 Spoke with insurance. PA form faxed to office, completed, and faxed back. Awaiting decision. Will reach out to patient once determined.

## 2023-09-19 NOTE — Telephone Encounter (Signed)
 Pharmacy Patient Advocate Encounter   Received notification from CoverMyMeds that prior authorization for ELIQUIS  is required/requested.   Insurance verification completed.   The patient is insured through [employer] .   PA required; PA submitted to above mentioned insurance via Fax Key/confirmation #/EOC [id number] Status is pending

## 2023-09-23 NOTE — Telephone Encounter (Signed)
 Pharmacy Patient Advocate Encounter  Received notification from [facility] that Prior Authorization for ELIQUIS  has been APPROVED from 09/23/23 to 09/22/24   PA #/Case ID/Reference #: [id number]

## 2023-10-01 ENCOUNTER — Ambulatory Visit: Admitting: Family Medicine

## 2023-10-04 ENCOUNTER — Ambulatory Visit (INDEPENDENT_AMBULATORY_CARE_PROVIDER_SITE_OTHER): Admitting: Family Medicine

## 2023-10-04 VITALS — BP 129/77 | HR 74 | Ht 72.0 in | Wt 283.8 lb

## 2023-10-04 DIAGNOSIS — Z86711 Personal history of pulmonary embolism: Secondary | ICD-10-CM

## 2023-10-04 DIAGNOSIS — M545 Low back pain, unspecified: Secondary | ICD-10-CM | POA: Diagnosis not present

## 2023-10-04 DIAGNOSIS — G8929 Other chronic pain: Secondary | ICD-10-CM | POA: Diagnosis not present

## 2023-10-04 DIAGNOSIS — I10 Essential (primary) hypertension: Secondary | ICD-10-CM

## 2023-10-04 MED ORDER — KETOROLAC TROMETHAMINE 60 MG/2ML IM SOLN
30.0000 mg | Freq: Once | INTRAMUSCULAR | Status: AC
Start: 2023-10-04 — End: 2023-10-04
  Administered 2023-10-04: 30 mg via INTRAMUSCULAR

## 2023-10-04 MED ORDER — AMLODIPINE BESYLATE-VALSARTAN 10-160 MG PO TABS: 1.0000 | ORAL_TABLET | Freq: Every day | ORAL | 0 refills | Status: DC

## 2023-10-04 NOTE — Assessment & Plan Note (Signed)
 Continue Eliquis .  He will let me know if he has any episodes of bleeding.  Unsure which anticoagulant his insurance will prefer; however, he will let me know over MyChart.

## 2023-10-04 NOTE — Progress Notes (Signed)
    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  He has been able to obtain this and is taking it regularly. States his insurance is wanting tp chanhge the prferred regimen for anticoagulant.  GERD Tried to come off of omeprazole  and add on famotidine ; however, symptoms were uncontrolled, patient went back to omeprazole .  His symptoms are stable.  Elevated BMI, concern for OSA Works second shift throughout the week.  Was previously in contact with referral coordinator to try to schedule sleep study, though unfortunately this has not been scheduled yet. He continues off ozempic and has continued to lose weight.  HTN Has been urinating more while on his medications, especially when he rides motorcycles. He would like some changes to medications today.  Hip pain, back pain Most of the time, they are together. He feels like the hip socket is the source of pain; he used to feel it click. His back pain is lower and on the right (the same side of the hip). The pain happens very fast and fleeting like a shooting pain. This pain makes it hard to walk at times. No weakness or numbness in the legs. No bowel or bladder incontinence.  OBJECTIVE:   BP 129/77   Pulse 74   Ht 6' (1.829 m)   Wt 283 lb 12.8 oz (128.7 kg)   SpO2 100%   BMI 38.49 kg/m   General: Alert and oriented, in NAD Skin: Warm, dry, and intact without lesions HEENT: NCAT, EOM grossly normal, midline nasal septum Respiratory/Back: Breathing and speaking comfortably on RA, mild TTP over R>L paraspinal muscles, mild TTP over anterior hip to deep palpation Neurological: No gross focal deficit, strength and sensation intact bilaterally in BLE, normal gait Psychiatric: Appropriate mood and affect   ASSESSMENT/PLAN:   Assessment & Plan Chronic bilateral low back pain without sciatica Acutely worsened.  Question some component of piriformis  syndrome versus greater trochanteric pain syndrome.  Given symptoms, discussed one-time dose of Toradol 30 mg IM for pain relief.  Continue Tylenol .  Avoid oral NSAIDs.  Will also refer to physical therapy.  Follow-up as needed. Hypertension, unspecified type Controlled; however, HCTZ component causing disruptive diuresis.  Will switch to amlodipine -valsartan  10-160 mg daily.  Patient will obtain BP monitor and take readings twice a day for 1 week after starting his medication and send to me via MyChart for further adjustments.  Also advised to contact his sleep medicine doctors, as OSA could be contributing to hypertension. History of pulmonary embolism Continue Eliquis .  He will let me know if he has any episodes of bleeding.  Unsure which anticoagulant his insurance will prefer; however, he will let me know over MyChart.   Stuart Redo, MD [facility]

## 2023-10-04 NOTE — Patient Instructions (Addendum)
 You obtained toradol today in an injection for pain. I have sent in a PT referral to help more long term with pain.  I have switched your BP medication. Let me know your BP values (twice a day) over the next week. Please be sure to have stayed seated for 5 minutes, have your feet flat on the floor, your arm at chest height on a table, and back against the chair.  Also let me know what blood thinner your insurance needs.  Be sure to contact the sleep medicine doctors to have your sleep study completed.  Come back in 3 months or sooner if needed.

## 2023-10-04 NOTE — Assessment & Plan Note (Signed)
 Controlled; however, HCTZ component causing disruptive diuresis.  Will switch to amlodipine -valsartan  10-160 mg daily.  Patient will obtain BP monitor and take readings twice a day for 1 week after starting his medication and send to me via MyChart for further adjustments.  Also advised to contact his sleep medicine doctors, as OSA could be contributing to hypertension.

## 2023-10-04 NOTE — Assessment & Plan Note (Signed)
 Acutely worsened.  Question some component of piriformis syndrome versus greater trochanteric pain syndrome.  Given symptoms, discussed one-time dose of Toradol 30 mg IM for pain relief.  Continue Tylenol .  Avoid oral NSAIDs.  Will also refer to physical therapy.  Follow-up as needed.

## 2023-10-06 ENCOUNTER — Encounter: Payer: Self-pay | Admitting: Family Medicine

## 2023-10-06 MED ORDER — LIDOCAINE 5 % EX PTCH: 1.0000 | MEDICATED_PATCH | CUTANEOUS | 0 refills | Status: AC

## 2023-10-06 MED ORDER — TIZANIDINE HCL 4 MG PO TABS: 4.0000 mg | ORAL_TABLET | Freq: Three times a day (TID) | ORAL | 1 refills | Status: AC | PRN

## 2023-10-06 NOTE — Telephone Encounter (Signed)
 Patient's lower back pain refractory to current therapies. Otherwise he is doing well per partner Alan. Will send in tizanidine  and lidocaine  patches to be applied over lower back. Advised to use voltaren gel over hip. Discussed potentially sedating effects of tizanidine  over MyChart. He will keep me updated on pain levels. Alan states he also picked up BP medication today and will start tomorrow; he will send me BP readings over MyChart in 1 week.

## 2023-10-07 ENCOUNTER — Other Ambulatory Visit (HOSPITAL_COMMUNITY): Payer: Self-pay

## 2023-10-28 ENCOUNTER — Other Ambulatory Visit: Payer: Self-pay | Admitting: Family Medicine

## 2023-11-27 ENCOUNTER — Other Ambulatory Visit: Payer: Self-pay | Admitting: Family Medicine

## 2023-12-24 ENCOUNTER — Other Ambulatory Visit: Payer: Self-pay | Admitting: Family Medicine

## 2024-04-24 ENCOUNTER — Ambulatory Visit: Payer: Self-pay

## 2024-05-01 ENCOUNTER — Ambulatory Visit
